# Patient Record
Sex: Female | Born: 1950 | Race: White | Hispanic: No | Marital: Married | Smoking: Never smoker
Health system: Southern US, Community
[De-identification: ages and names within clinical notes are randomized; demographics above are authoritative.]

## PROBLEM LIST (undated history)

## (undated) DIAGNOSIS — J189 Pneumonia, unspecified organism: Secondary | ICD-10-CM

## (undated) DIAGNOSIS — K635 Polyp of colon: Secondary | ICD-10-CM

## (undated) DIAGNOSIS — T7840XA Allergy, unspecified, initial encounter: Secondary | ICD-10-CM

## (undated) HISTORY — PX: TONSILLECTOMY: SUR1361

## (undated) HISTORY — PX: APPENDECTOMY: SHX54

## (undated) HISTORY — PX: VAGINAL HYSTERECTOMY: SHX2639

## (undated) HISTORY — DX: Polyp of colon: K63.5

## (undated) HISTORY — DX: Allergy, unspecified, initial encounter: T78.40XA

## (undated) HISTORY — DX: Pneumonia, unspecified organism: J18.9

---

## 1999-03-25 ENCOUNTER — Other Ambulatory Visit: Admission: RE | Admit: 1999-03-25 | Discharge: 1999-03-25 | Payer: Self-pay | Admitting: Obstetrics and Gynecology

## 2000-08-11 ENCOUNTER — Encounter: Admission: RE | Admit: 2000-08-11 | Discharge: 2000-08-11 | Payer: Self-pay | Admitting: Family Medicine

## 2000-08-11 ENCOUNTER — Encounter: Payer: Self-pay | Admitting: Family Medicine

## 2001-02-17 ENCOUNTER — Encounter: Admission: RE | Admit: 2001-02-17 | Discharge: 2001-02-17 | Payer: Self-pay | Admitting: Family Medicine

## 2001-02-17 ENCOUNTER — Encounter: Payer: Self-pay | Admitting: Family Medicine

## 2001-05-18 ENCOUNTER — Encounter (INDEPENDENT_AMBULATORY_CARE_PROVIDER_SITE_OTHER): Payer: Self-pay | Admitting: Specialist

## 2001-05-18 ENCOUNTER — Ambulatory Visit (HOSPITAL_BASED_OUTPATIENT_CLINIC_OR_DEPARTMENT_OTHER): Admission: RE | Admit: 2001-05-18 | Discharge: 2001-05-18 | Payer: Self-pay | Admitting: Plastic Surgery

## 2002-05-02 ENCOUNTER — Encounter: Admission: RE | Admit: 2002-05-02 | Discharge: 2002-05-02 | Payer: Self-pay | Admitting: Family Medicine

## 2002-05-02 ENCOUNTER — Encounter: Payer: Self-pay | Admitting: Family Medicine

## 2002-05-04 ENCOUNTER — Encounter: Payer: Self-pay | Admitting: Family Medicine

## 2002-05-04 ENCOUNTER — Encounter: Admission: RE | Admit: 2002-05-04 | Discharge: 2002-05-04 | Payer: Self-pay | Admitting: Family Medicine

## 2004-05-06 ENCOUNTER — Encounter: Admission: RE | Admit: 2004-05-06 | Discharge: 2004-05-06 | Payer: Self-pay | Admitting: Family Medicine

## 2004-05-14 ENCOUNTER — Ambulatory Visit (HOSPITAL_COMMUNITY): Admission: RE | Admit: 2004-05-14 | Discharge: 2004-05-14 | Payer: Self-pay | Admitting: Plastic Surgery

## 2004-05-14 ENCOUNTER — Encounter (INDEPENDENT_AMBULATORY_CARE_PROVIDER_SITE_OTHER): Payer: Self-pay | Admitting: Specialist

## 2004-05-14 ENCOUNTER — Ambulatory Visit (HOSPITAL_BASED_OUTPATIENT_CLINIC_OR_DEPARTMENT_OTHER): Admission: RE | Admit: 2004-05-14 | Discharge: 2004-05-14 | Payer: Self-pay | Admitting: Plastic Surgery

## 2004-07-08 ENCOUNTER — Other Ambulatory Visit: Admission: RE | Admit: 2004-07-08 | Discharge: 2004-07-08 | Payer: Self-pay | Admitting: Family Medicine

## 2005-07-03 ENCOUNTER — Encounter: Admission: RE | Admit: 2005-07-03 | Discharge: 2005-07-03 | Payer: Self-pay | Admitting: Family Medicine

## 2005-07-08 ENCOUNTER — Encounter: Admission: RE | Admit: 2005-07-08 | Discharge: 2005-07-08 | Payer: Self-pay | Admitting: Family Medicine

## 2005-08-14 ENCOUNTER — Other Ambulatory Visit: Admission: RE | Admit: 2005-08-14 | Discharge: 2005-08-14 | Payer: Self-pay | Admitting: Family Medicine

## 2006-01-08 ENCOUNTER — Encounter: Admission: RE | Admit: 2006-01-08 | Discharge: 2006-01-08 | Payer: Self-pay | Admitting: Family Medicine

## 2006-07-05 ENCOUNTER — Encounter: Admission: RE | Admit: 2006-07-05 | Discharge: 2006-07-05 | Payer: Self-pay | Admitting: Family Medicine

## 2007-05-13 ENCOUNTER — Other Ambulatory Visit: Admission: RE | Admit: 2007-05-13 | Discharge: 2007-05-13 | Payer: Self-pay | Admitting: Family Medicine

## 2007-07-25 ENCOUNTER — Encounter: Admission: RE | Admit: 2007-07-25 | Discharge: 2007-07-25 | Payer: Self-pay | Admitting: Family Medicine

## 2008-08-30 ENCOUNTER — Encounter: Admission: RE | Admit: 2008-08-30 | Discharge: 2008-08-30 | Payer: Self-pay | Admitting: Family Medicine

## 2009-08-02 ENCOUNTER — Other Ambulatory Visit: Admission: RE | Admit: 2009-08-02 | Discharge: 2009-08-02 | Payer: Self-pay | Admitting: Family Medicine

## 2010-11-10 ENCOUNTER — Other Ambulatory Visit: Payer: Self-pay | Admitting: Family Medicine

## 2010-11-10 DIAGNOSIS — Z1231 Encounter for screening mammogram for malignant neoplasm of breast: Secondary | ICD-10-CM

## 2010-11-24 ENCOUNTER — Ambulatory Visit
Admission: RE | Admit: 2010-11-24 | Discharge: 2010-11-24 | Disposition: A | Discharge status: Home / Self Care | Payer: BC Managed Care – PPO | Source: Ambulatory Visit | Attending: Family Medicine | Admitting: Family Medicine

## 2010-11-24 DIAGNOSIS — Z1231 Encounter for screening mammogram for malignant neoplasm of breast: Secondary | ICD-10-CM

## 2010-12-05 NOTE — Op Note (Signed)
[place]. [facility]  Patient:    Melanie Phelps, Melanie Phelps Visit Number: [id number] MRN: [id number]          Service Type: DSU Location: [facility] Attending Physician:  Chapman Moss Dictated by:   Teena Irani. Odis Luster, M.D. Proc. Date: 05/18/01 Admit Date:  05/18/2001                             Operative Report  PREOPERATIVE DIAGNOSES: 1. Lesions, face x 3, undetermined behavior. 2. Lesion, neck, undetermined behavior. 3. Lesions, trunk x 11, all less than 0.5 cm, of undetermined behavior. 4. Lesions, trunk x 2, greater than 0.5 cm, of undetermined behavior.  POSTOPERATIVE DIAGNOSES: 1. Lesions, face x 3, undetermined behavior. 2. Lesion, neck, undetermined behavior. 3. Lesions, trunk x 11, all less than 0.5 cm, of undetermined behavior. 4. Lesions, trunk x 2, greater than 0.5 cm, of undetermined behavior.  PROCEDURES: 1. Excision lesions, face x 3, of undetermined behavior, less than 0.5 cm. 2. Excision lesion, neck, less than 0.5 cm, of undetermined behavior. 3. Excision lesions of the trunk x 11, less than 0.5 cm, of undetermined    behavior. 4. Excision lesions of the trunk greater than 0.5 cm, of undetermined    behavior, x 2.  SURGEON:  Teena Irani. Odis Luster, M.D.  ANESTHESIA:  1% Xylocaine with epinephrine plus bicarb.  CLINICAL NOTE:  This is a 60 year old woman who has multiple pigmented lesions with irregular borders that have changed, and it is felt that it is medically necessary to excise these.  Initially the procedure and the risks were discussed with her, including the possibility of further surgery depending on the final pathology report.  She also understood the risk of scarring, wound healing problems, and wished to proceed.  DESCRIPTION OF PROCEDURE:  The patient taken to the operating room and placed first supine.  She was prepped with Betadine and draped with sterile drapes. Satisfactory local anesthesia was achieved, and the  excisions were performed in the direction of the relaxed skin tension lines.  The wound was irrigated thoroughly, good hemostasis had been noted.  The closure was with 6-0 Prolene simple interrupted sutures for the facial area in the neck and 6-0 and 5-0 Prolene simple interrupted sutures for the anterior chest.  Patient then placed left lateral decubitus and an additional six lesions were marked on her back, prepped with Betadine, draped with sterile drapes.  Again subsequently anesthesia achieved, elliptical excisions were performed, and the wound was irrigated thoroughly and good hemostasis having been confirmed, the closure was with 5-0 Prolene simple interrupted sutures.  Two of these wounds did require some 4-0 Monocryl interrupted inverted deep sutures and then 4-0 Monocryl simple interrupted suture.  Antibiotic ointment and dry sterile dressings were applied.  She tolerated the procedure well.  DISPOSITION:  Darvocet-N 100 one p.o. q.4h., for pain.  She will call the office for an appointment next week.  No exercising, no activities until I see her back next week. Dictated by:   Teena Irani. Odis Luster, M.D. Attending Physician:  Chapman Moss DD:  05/18/01 TD:  05/19/01 Job: [id number]

## 2010-12-05 NOTE — Op Note (Signed)
[name], Melanie Phelps           ACCOUNT NO.:  [id number]   MEDICAL RECORD NO.:  [id number]          PATIENT TYPE:  AMB   LOCATION:  DSC                          FACILITY:  [facility]   PHYSICIAN:  Etter Sjogren, M.D.     DATE OF BIRTH:  31-Aug-1950   DATE OF PROCEDURE:  05/14/2004  DATE OF DISCHARGE:                                 OPERATIVE REPORT   PREOPERATIVE DIAGNOSIS:  Multiple skin lesions of undetermined behavior, two  greater than 0.5 cm, two less than 0.5 cm.   POSTOPERATIVE DIAGNOSIS:  Multiple skin lesions of undetermined behavior,  two greater than 0.5 cm, two less than 0.5 cm.   PROCEDURE PERFORMED:  Excision multiple skin lesions of undetermined  behavior, one on the neck less than 0.5 cm, three on the chest, two of which  are greater than 0.5 cm.   SURGEON:  Etter Sjogren, M.D.   ANESTHESIA:  1% Xylocaine with epinephrine.   CLINICAL NOTE:  60 year old woman with multiple lesions, irritated,  changing, enlarging, and it is medically necessary to remove them.  The  nature of the procedure and risks were discussed including scarring,  recurrence, further surgery depending upon the final pathology report.  She  wished to proceed.   DESCRIPTION OF PROCEDURE:  The patient was taken to the operating room and  placed prone.  She was prepped with Betadine and draped with sterile drapes.  A satisfactory level of anesthsia was achieved with 1% Xylocaine with  epinephrine plus bicarb.  An excision was performed.  The closure was with 4-  0 Monocryl interrupted deep suture and 4-0 Monocryl running subcuticular  suture.  Steri-Strips and dry, sterile dressings were applied.  She  tolerated the procedure well.   DISPOSITION:  Recheck in the office next week.       DB/MEDQ  D:  05/14/2004  T:  05/14/2004  Job:  [id number]

## 2013-05-19 ENCOUNTER — Other Ambulatory Visit (HOSPITAL_COMMUNITY)
Admission: RE | Admit: 2013-05-19 | Discharge: 2013-05-19 | Disposition: A | Discharge status: Home / Self Care | Payer: BC Managed Care – PPO | Source: Ambulatory Visit | Attending: Family Medicine | Admitting: Family Medicine

## 2013-05-19 ENCOUNTER — Other Ambulatory Visit: Payer: Self-pay | Admitting: Family Medicine

## 2013-05-19 DIAGNOSIS — Z124 Encounter for screening for malignant neoplasm of cervix: Secondary | ICD-10-CM | POA: Insufficient documentation

## 2013-05-19 DIAGNOSIS — Z1151 Encounter for screening for human papillomavirus (HPV): Secondary | ICD-10-CM | POA: Insufficient documentation

## 2015-06-10 ENCOUNTER — Other Ambulatory Visit: Payer: Self-pay

## 2015-06-10 DIAGNOSIS — Z1231 Encounter for screening mammogram for malignant neoplasm of breast: Secondary | ICD-10-CM

## 2015-06-11 ENCOUNTER — Ambulatory Visit: Payer: BC Managed Care – PPO

## 2015-06-12 ENCOUNTER — Ambulatory Visit
Admission: RE | Admit: 2015-06-12 | Discharge: 2015-06-12 | Disposition: A | Discharge status: Home / Self Care | Payer: BLUE CROSS/BLUE SHIELD | Source: Ambulatory Visit

## 2015-06-12 DIAGNOSIS — Z1231 Encounter for screening mammogram for malignant neoplasm of breast: Secondary | ICD-10-CM

## 2016-09-21 ENCOUNTER — Other Ambulatory Visit: Payer: Self-pay | Admitting: Family Medicine

## 2016-09-21 DIAGNOSIS — Z1231 Encounter for screening mammogram for malignant neoplasm of breast: Secondary | ICD-10-CM

## 2016-10-12 ENCOUNTER — Ambulatory Visit: Payer: BLUE CROSS/BLUE SHIELD

## 2016-10-28 ENCOUNTER — Encounter: Payer: Self-pay | Admitting: Radiology

## 2016-10-28 ENCOUNTER — Ambulatory Visit
Admission: RE | Admit: 2016-10-28 | Discharge: 2016-10-28 | Disposition: A | Discharge status: Home / Self Care | Payer: BLUE CROSS/BLUE SHIELD | Source: Ambulatory Visit | Attending: Family Medicine | Admitting: Family Medicine

## 2016-10-28 DIAGNOSIS — Z1231 Encounter for screening mammogram for malignant neoplasm of breast: Secondary | ICD-10-CM

## 2016-10-29 ENCOUNTER — Other Ambulatory Visit: Payer: Self-pay | Admitting: Family Medicine

## 2016-10-29 DIAGNOSIS — R928 Other abnormal and inconclusive findings on diagnostic imaging of breast: Secondary | ICD-10-CM

## 2016-11-02 ENCOUNTER — Ambulatory Visit
Admission: RE | Admit: 2016-11-02 | Discharge: 2016-11-02 | Disposition: A | Discharge status: Home / Self Care | Payer: BLUE CROSS/BLUE SHIELD | Source: Ambulatory Visit | Attending: Family Medicine | Admitting: Family Medicine

## 2016-11-02 DIAGNOSIS — R928 Other abnormal and inconclusive findings on diagnostic imaging of breast: Secondary | ICD-10-CM

## 2017-05-02 DIAGNOSIS — Z23 Encounter for immunization: Secondary | ICD-10-CM | POA: Diagnosis not present

## 2017-05-10 DIAGNOSIS — M25572 Pain in left ankle and joints of left foot: Secondary | ICD-10-CM | POA: Diagnosis not present

## 2017-05-12 DIAGNOSIS — R05 Cough: Secondary | ICD-10-CM | POA: Diagnosis not present

## 2017-05-29 DIAGNOSIS — M940 Chondrocostal junction syndrome [Tietze]: Secondary | ICD-10-CM | POA: Diagnosis not present

## 2017-05-29 DIAGNOSIS — J069 Acute upper respiratory infection, unspecified: Secondary | ICD-10-CM | POA: Diagnosis not present

## 2017-06-08 DIAGNOSIS — J209 Acute bronchitis, unspecified: Secondary | ICD-10-CM | POA: Diagnosis not present

## 2017-06-14 DIAGNOSIS — J329 Chronic sinusitis, unspecified: Secondary | ICD-10-CM | POA: Diagnosis not present

## 2017-06-14 DIAGNOSIS — R05 Cough: Secondary | ICD-10-CM | POA: Diagnosis not present

## 2017-06-14 DIAGNOSIS — R509 Fever, unspecified: Secondary | ICD-10-CM | POA: Diagnosis not present

## 2017-06-15 ENCOUNTER — Other Ambulatory Visit: Payer: Self-pay | Admitting: Family Medicine

## 2017-06-15 ENCOUNTER — Ambulatory Visit
Admission: RE | Admit: 2017-06-15 | Discharge: 2017-06-15 | Disposition: A | Discharge status: Home / Self Care | Payer: BLUE CROSS/BLUE SHIELD | Source: Ambulatory Visit | Attending: Family Medicine | Admitting: Family Medicine

## 2017-06-15 DIAGNOSIS — R059 Cough, unspecified: Secondary | ICD-10-CM

## 2017-06-15 DIAGNOSIS — R05 Cough: Secondary | ICD-10-CM | POA: Diagnosis not present

## 2017-06-17 ENCOUNTER — Telehealth: Payer: Self-pay | Admitting: Pulmonary Disease

## 2017-06-17 NOTE — Telephone Encounter (Signed)
error 

## 2017-06-21 ENCOUNTER — Encounter: Payer: Self-pay | Admitting: Pulmonary Disease

## 2017-06-21 ENCOUNTER — Ambulatory Visit (INDEPENDENT_AMBULATORY_CARE_PROVIDER_SITE_OTHER): Payer: BLUE CROSS/BLUE SHIELD | Admitting: Pulmonary Disease

## 2017-06-21 VITALS — BP 110/65 | HR 87 | Ht 61.5 in | Wt 134.0 lb

## 2017-06-21 DIAGNOSIS — J90 Pleural effusion, not elsewhere classified: Secondary | ICD-10-CM

## 2017-06-21 DIAGNOSIS — J189 Pneumonia, unspecified organism: Secondary | ICD-10-CM

## 2017-06-21 NOTE — Patient Instructions (Signed)
Left-sided pleural effusion: I believe that this is a parapneumonic effusion, or pneumonia associated pleural fluid To assess this further we will arrange for an ultrasound-guided thoracentesis of the left pleural effusion: We will send lab work for cell count and differential, culture, cytology, LDH, glucose, pH, protein, albumin.  We will also need to get 2 blood tests: LDH and protein  Community-acquired pneumonia: As we stated today in clinic, this will make you feel rundown and cough for weeks to months We will follow you closely with repeat chest x-rays to make sure this goes away, we will arrange for the next visit with our nurse practitioner in 3 weeks either here or at [facility]
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
## 2017-06-23 ENCOUNTER — Ambulatory Visit (HOSPITAL_COMMUNITY)
Admission: RE | Admit: 2017-06-23 | Discharge: 2017-06-23 | Disposition: A | Discharge status: Home / Self Care | Payer: BLUE CROSS/BLUE SHIELD | Source: Ambulatory Visit | Attending: Pulmonary Disease | Admitting: Pulmonary Disease

## 2017-06-23 ENCOUNTER — Other Ambulatory Visit: Payer: Self-pay | Admitting: Pulmonary Disease

## 2017-06-23 ENCOUNTER — Ambulatory Visit (INDEPENDENT_AMBULATORY_CARE_PROVIDER_SITE_OTHER)
Admission: RE | Admit: 2017-06-23 | Discharge: 2017-06-23 | Disposition: A | Discharge status: Home / Self Care | Payer: BLUE CROSS/BLUE SHIELD | Source: Ambulatory Visit | Attending: Pulmonary Disease | Admitting: Pulmonary Disease

## 2017-06-23 DIAGNOSIS — R0600 Dyspnea, unspecified: Secondary | ICD-10-CM

## 2017-06-23 DIAGNOSIS — Z538 Procedure and treatment not carried out for other reasons: Secondary | ICD-10-CM | POA: Insufficient documentation

## 2017-06-23 DIAGNOSIS — J90 Pleural effusion, not elsewhere classified: Secondary | ICD-10-CM

## 2017-06-23 LAB — PROTEIN ELECTROPHORESIS, SERUM
A/G Ratio: 1 (ref 0.7–1.7)
ALPHA 1: 0.4 g/dL (ref 0.0–0.4)
ALPHA 2: 1.2 g/dL — AB (ref 0.4–1.0)
Albumin ELP: 2.9 g/dL (ref 2.9–4.4)
Beta: 1 g/dL (ref 0.7–1.3)
GAMMA GLOBULIN: 0.5 g/dL (ref 0.4–1.8)
Globulin, Total: 3 g/dL (ref 2.2–3.9)
Total Protein: 5.9 g/dL — ABNORMAL LOW (ref 6.0–8.5)

## 2017-06-23 LAB — LACTATE DEHYDROGENASE: LDH: 168 IU/L (ref 119–226)

## 2017-06-23 MED ORDER — LIDOCAINE 2% (20 MG/ML) 5 ML SYRINGE
INTRAMUSCULAR | Status: AC
  Filled 2017-06-23: qty 10

## 2017-06-23 MED ORDER — LIDOCAINE HCL (PF) 2 % IJ SOLN: INTRAMUSCULAR | Status: DC | PRN

## 2017-06-25 ENCOUNTER — Ambulatory Visit: Payer: BLUE CROSS/BLUE SHIELD | Admitting: Adult Health

## 2017-06-25 ENCOUNTER — Ambulatory Visit (INDEPENDENT_AMBULATORY_CARE_PROVIDER_SITE_OTHER)
Admission: RE | Admit: 2017-06-25 | Discharge: 2017-06-25 | Disposition: A | Discharge status: Home / Self Care | Payer: BLUE CROSS/BLUE SHIELD | Source: Ambulatory Visit | Attending: Adult Health | Admitting: Adult Health

## 2017-06-25 ENCOUNTER — Encounter: Payer: Self-pay | Admitting: Adult Health

## 2017-06-25 ENCOUNTER — Other Ambulatory Visit (INDEPENDENT_AMBULATORY_CARE_PROVIDER_SITE_OTHER): Payer: BLUE CROSS/BLUE SHIELD

## 2017-06-25 VITALS — BP 112/70 | HR 104 | Temp 98.7°F | Wt 132.6 lb

## 2017-06-25 DIAGNOSIS — R059 Cough, unspecified: Secondary | ICD-10-CM

## 2017-06-25 DIAGNOSIS — R05 Cough: Secondary | ICD-10-CM

## 2017-06-25 DIAGNOSIS — R0989 Other specified symptoms and signs involving the circulatory and respiratory systems: Secondary | ICD-10-CM

## 2017-06-25 DIAGNOSIS — R509 Fever, unspecified: Secondary | ICD-10-CM

## 2017-06-25 DIAGNOSIS — J90 Pleural effusion, not elsewhere classified: Secondary | ICD-10-CM | POA: Insufficient documentation

## 2017-06-25 DIAGNOSIS — J189 Pneumonia, unspecified organism: Secondary | ICD-10-CM | POA: Insufficient documentation

## 2017-06-25 DIAGNOSIS — R0602 Shortness of breath: Secondary | ICD-10-CM

## 2017-06-25 LAB — BASIC METABOLIC PANEL
BUN: 13 mg/dL (ref 6–23)
CALCIUM: 9.2 mg/dL (ref 8.4–10.5)
CHLORIDE: 104 meq/L (ref 96–112)
CO2: 27 meq/L (ref 19–32)
CREATININE: 0.6 mg/dL (ref 0.40–1.20)
GFR: 106.04 mL/min (ref >60.00)
GLUCOSE: 108 mg/dL — AB (ref 70–99)
Potassium: 4.2 mEq/L (ref 3.5–5.1)
Sodium: 140 mEq/L (ref 135–145)

## 2017-06-25 LAB — CBC WITH DIFFERENTIAL/PLATELET
BASOS ABS: 0.1 10*3/uL (ref 0.0–0.1)
BASOS PCT: 0.5 % (ref 0.0–3.0)
EOS ABS: 0 10*3/uL (ref 0.0–0.7)
Eosinophils Relative: 0.2 % (ref 0.0–5.0)
HEMATOCRIT: 37 % (ref 36.0–46.0)
Hemoglobin: 12.1 g/dL (ref 12.0–15.0)
LYMPHS ABS: 1.3 10*3/uL (ref 0.7–4.0)
Lymphocytes Relative: 8.2 % — ABNORMAL LOW (ref 12.0–46.0)
MCHC: 32.8 g/dL (ref 30.0–36.0)
MCV: 92 fl (ref 78.0–100.0)
MONO ABS: 1 10*3/uL (ref 0.1–1.0)
Monocytes Relative: 6.3 % (ref 3.0–12.0)
NEUTROS ABS: 13 10*3/uL — AB (ref 1.4–7.7)
NEUTROS PCT: 84.8 % — AB (ref 43.0–77.0)
PLATELETS: 715 10*3/uL — AB (ref 150.0–400.0)
RBC: 4.02 Mil/uL (ref 3.87–5.11)
RDW: 13.4 % (ref 11.5–15.5)
WBC: 15.3 10*3/uL — ABNORMAL HIGH (ref 4.0–10.5)

## 2017-06-25 LAB — BRAIN NATRIURETIC PEPTIDE: Pro B Natriuretic peptide (BNP): 205 pg/mL — ABNORMAL HIGH (ref 0.0–100.0)

## 2017-06-25 LAB — SEDIMENTATION RATE: Sed Rate: 63 mm/hr — ABNORMAL HIGH (ref 0–30)

## 2017-06-25 MED ORDER — AMOXICILLIN-POT CLAVULANATE 875-125 MG PO TABS: 1.0000 | ORAL_TABLET | Freq: Two times a day (BID) | ORAL | 0 refills | Status: AC

## 2017-06-25 NOTE — Progress Notes (Signed)
Reviewed, agree, may need CT chest next week

## 2017-06-25 NOTE — Progress Notes (Signed)
@Patient  ID: Melanie Phelps, female    DOB: 12-21-1950, 66 y.o.   MRN: [id number]  Chief Complaint  Patient presents with  . Follow-up    PNA     Referring provider: Maurice Small, MD  HPI: 66 yo female never smoker seen for pulmonary consult for PNA 06/21/17  Hx of ARDS in 1992 (twin birth)   06/25/2017 Acute visit : PNA and Pleural Effusion  Presents for an acute office visit.  Patient was seen in the office for a pulmonary consult on June 21, 2017.  Patient complaining of over the last 2 months she has had intermittent cough and shortness of breath.  She was recently seen by her primary care physician and treated for a bronchitis.  She took a Z-Pak without significant improvement.  Chest x-ray was done on November 27 showing a small right-sided pleural effusion and a moderate left-sided effusion..  Patient was started on Augmentin for 10 days.  She finished her last dose yesterday.  Patient says she was starting to improve with decreased cough.  However last night she noticed that she was running a fever again with temperature at 101.5.  She denies any hemoptysis chest pain orthopnea PND or leg swelling.  Patient says she continues to have sinus congestion drainage and intermittent green nasal discharge.  Appetite is good with no nausea vomiting or diarrhea.. Patient was felt to have a possible parapneumonic effusion and was sent for a thoracentesis however effusions had decreased in size and was too small for thoracentesis.. Chest x-ray today shows small left effusion and a trace right pleural effusion. .      Allergies  Allergen Reactions  . Ciprofloxacin Rash    Immunization History  Administered Date(s) Administered  . Influenza,inj,Quad PF,6+ Mos 05/26/2017    History reviewed. No pertinent past medical history.  Tobacco History: Social History   Tobacco Use  Smoking Status Never Smoker  Smokeless Tobacco Never Used   Counseling given: Not  Answered   Outpatient Encounter Medications as of 06/25/2017  Medication Sig  . amoxicillin-clavulanate (AUGMENTIN) 875-125 MG tablet   . fluticasone (FLONASE) 50 MCG/ACT nasal spray   . ipratropium (ATROVENT) 0.06 % nasal spray   . montelukast (SINGULAIR) 10 MG tablet Take 10 mg by mouth at bedtime.  . Multiple Vitamin (MULTIVITAMIN) capsule Take by mouth.  Marland Kitchen amoxicillin-clavulanate (AUGMENTIN) 875-125 MG tablet Take 1 tablet by mouth 2 (two) times daily for 4 days.   No facility-administered encounter medications on file as of 06/25/2017.      Review of Systems  Constitutional:   No  weight loss, night sweats,   +Fevers, chills, fatigue, or  lassitude.  HEENT:   No headaches,  Difficulty swallowing,  Tooth/dental problems, or  Sore throat,                No sneezing, itching, ear ache,  +nasal congestion, post nasal drip,   CV:  No chest pain,  Orthopnea, PND, swelling in lower extremities, anasarca, dizziness, palpitations, syncope.   GI  No heartburn, indigestion, abdominal pain, nausea, vomiting, diarrhea, change in bowel habits, loss of appetite, bloody stools.   Resp:   No chest wall deformity  Skin: no rash or lesions.  GU: no dysuria, change in color of urine, no urgency or frequency.  No flank pain, no hematuria   MS:  No joint pain or swelling.  No decreased range of motion.  No back pain.    Physical Exam  BP 112/70 (BP  Location: Left Arm, Cuff Size: Normal)   Pulse (!) 104   Temp 98.7 F (37.1 C) (Oral)   Wt 132 lb 9.6 oz (60.1 kg)   SpO2 96%   BMI 24.65 kg/m   GEN: A/Ox3; pleasant , NAD    HEENT:  [place]/AT,  EACs-clear, TMs-wnl, NOSE-clear, THROAT-clear, no lesions, no postnasal drip or exudate noted.   NECK:  Supple w/ fair ROM; no JVD; normal carotid impulses w/o bruits; no thyromegaly or nodules palpated; no lymphadenopathy.    RESP  Clear  P & A; w/o, wheezes/ rales/ or rhonchi. no accessory muscle use, no dullness to percussion  CARD:  RRR, no  m/r/g, no peripheral edema, pulses intact, no cyanosis or clubbing.  GI:   Soft & nt; nml bowel sounds; no organomegaly or masses detected.   Musco: Warm bil, no deformities or joint swelling noted.   Neuro: alert, no focal deficits noted.    Skin: Warm, no lesions or rashes    Lab Results:  CBC  Imaging: Dg Chest 2 View  Result Date: 06/25/2017 CLINICAL DATA:  Persistent fever, cough, and congestion. EXAM: CHEST  2 VIEW COMPARISON:  Chest x-ray dated June 23, 2017. FINDINGS: The cardiomediastinal silhouette is borderline enlarged. Normal pulmonary vascularity. Stable small left and trace right pleural effusions. No consolidation or pneumothorax. No acute osseous abnormality. IMPRESSION: 1. Stable small left and trace right pleural effusions. No consolidation. Electronically Signed   By: Titus Dubin M.D.   On: 06/25/2017 11:24   Dg Chest 2 View  Result Date: 06/23/2017 CLINICAL DATA:  Pleural effusion and dyspnea EXAM: CHEST  2 VIEW COMPARISON:  06/15/2017 FINDINGS: Interval decrease in bilateral pleural effusions. There is minimal blunting of the right costophrenic angle laterally from trace to small pleural effusion with small to moderate left effusion spanning 2 vertebral body heights on the lateral view versus 2-1/2 vertebral body heights previously. Heart is top normal. There is minimal aortic atherosclerosis. No pneumonic consolidations. There is atelectasis at each lung base. No acute nor suspicious osseous abnormality. IMPRESSION: Interval decrease in bilateral pleural effusions. Electronically Signed   By: Ashley Royalty M.D.   On: 06/23/2017 21:59   Dg Chest 2 View  Result Date: 06/15/2017 CLINICAL DATA:  66 year old female with dry cough for 8 weeks. Chest soreness and shortness of breath. Now with fever. Initial encounter. EXAM: CHEST  2 VIEW COMPARISON:  None. FINDINGS: Moderate left-sided and small right-sided pleural effusions. Basilar consolidation may represent  associated atelectasis and/ or infiltrate. Difficult to exclude underlying mass. Cardiomegaly (cannot exclude pericardial effusion). Central pulmonary vascular prominence. No cephalization of vasculature as may be expected with pulmonary edema. No acute osseous abnormality. IMPRESSION: Moderate left-sided and small right-sided pleural effusions. Basilar consolidation may represent associated infiltrate and/ or atelectasis. Follow-up until clearance will be necessary to exclude underlying mass. Although there is cardiomegaly and central pulmonary vascular prominence, the pleural effusions are not in a typical distribution for pulmonary edema (greater on the left than on the right). Additionally, patient has a fever. These results will be called to the ordering clinician or representative by the Radiologist Assistant, and communication documented in the PACS or zVision Dashboard. Electronically Signed   By: Genia Del M.D.   On: 06/15/2017 10:58   Ir US Chest  Result Date: 06/23/2017 CLINICAL DATA:  Left-sided pleural effusion. Please perform ultrasound-guided thoracentesis. EXAM: CHEST ULTRASOUND COMPARISON:  Chest radiograph - 06/15/2017 FINDINGS: Sonographic evaluation demonstrates only a trace amount of left-sided pleural fluid, too small  to allow for safe ultrasound-guided thoracentesis. Sonographic evaluation demonstrates no significant right-sided pleural fluid. IMPRESSION: 1. Trace amount of left-sided pleural fluid, too small to allow for safe ultrasound-guided thoracentesis. 2. No significant right-sided pleural fluid. Electronically Signed   By: Sandi Mariscal M.D.   On: 06/23/2017 13:22     Assessment & Plan:   Pneumonia Suspected community-acquired pneumonia with. Pneumonic effusions improving with antibiotics. Unclear why patient has persistent fevers influenza swab is negative today.  Labs are pending with CBC We will extend out Augmentin for an additional 4 days.  Patient will return for  follow-up in 4 days.  If fevers persist may need to do a CT sinus and chest.  Plan  Patient Instructions  Extend Augmentin for 4 days. Take with food, eat yogurt.  Mucinex DM Twice daily  As needed  Cough/[place] .  Fluids rest and tylenol As needed   Labs today .  Follow up with Dr. [place] Bells or Bora Broner NP in 4 days .  Please contact office for sooner follow up if symptoms do not improve or worsen or seek emergency care       Pleural effusion L>R Effusions now resolving with abx  Most likely parapneumonic .  Will continue to follow on cxr  Labs pending .      Rexene Edison, NP 06/25/2017

## 2017-06-25 NOTE — Patient Instructions (Addendum)
Extend Augmentin for 4 days. Take with food, eat yogurt.  Mucinex DM Twice daily  As needed  Cough/[place] .  Fluids rest and tylenol As needed   Labs today .  Follow up with Dr. [place] Bells or Ruthella Kirchman NP in 4 days .  Please contact office for sooner follow up if symptoms do not improve or worsen or seek emergency care

## 2017-06-25 NOTE — Assessment & Plan Note (Signed)
Suspected community-acquired pneumonia with. Pneumonic effusions improving with antibiotics. Unclear why patient has persistent fevers influenza swab is negative today.  Labs are pending with CBC We will extend out Augmentin for an additional 4 days.  Patient will return for follow-up in 4 days.  If fevers persist may need to do a CT sinus and chest.  Plan  Patient Instructions  Extend Augmentin for 4 days. Take with food, eat yogurt.  Mucinex DM Twice daily  As needed  Cough/[place] .  Fluids rest and tylenol As needed   Labs today .  Follow up with Dr. Lake Bells or Weyman Bogdon NP in 4 days .  Please contact office for sooner follow up if symptoms do not improve or worsen or seek emergency care

## 2017-06-25 NOTE — Assessment & Plan Note (Signed)
L>R Effusions now resolving with abx  Most likely parapneumonic .  Will continue to follow on cxr  Labs pending .

## 2017-06-29 ENCOUNTER — Ambulatory Visit: Payer: BLUE CROSS/BLUE SHIELD | Admitting: Adult Health

## 2017-06-30 ENCOUNTER — Encounter: Payer: Self-pay | Admitting: Adult Health

## 2017-06-30 ENCOUNTER — Other Ambulatory Visit: Payer: Self-pay | Admitting: Adult Health

## 2017-06-30 ENCOUNTER — Ambulatory Visit (INDEPENDENT_AMBULATORY_CARE_PROVIDER_SITE_OTHER)
Admission: RE | Admit: 2017-06-30 | Discharge: 2017-06-30 | Disposition: A | Discharge status: Home / Self Care | Payer: BLUE CROSS/BLUE SHIELD | Source: Ambulatory Visit | Attending: Adult Health | Admitting: Adult Health

## 2017-06-30 ENCOUNTER — Ambulatory Visit: Payer: BLUE CROSS/BLUE SHIELD | Admitting: Adult Health

## 2017-06-30 DIAGNOSIS — J90 Pleural effusion, not elsewhere classified: Secondary | ICD-10-CM

## 2017-06-30 DIAGNOSIS — J189 Pneumonia, unspecified organism: Secondary | ICD-10-CM | POA: Diagnosis not present

## 2017-06-30 NOTE — Progress Notes (Signed)
@Patient  ID: Melanie Phelps, female    DOB: 1950-11-09, 66 y.o.   MRN: [id number]  Chief Complaint  Patient presents with  . 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  Patient was seen last visit after she was seen for pneumonia and pleural effusion.  Over the last 2 months she had intermittent cough and shortness of breath.  She was initially seen by her primary care physician and treated for bronchitis with a Z-Pak.  Chest x-ray on November 27 showed a small right-sided pleural effusion and a moderate-sized left effusion.  Patient was started on a 10-day course of Augmentin.  Last week patient was continued to have persistent fevers up to 101.5.  Patient had been sent for a thoracentesis however there was not enough pleural fluid for tap.  Chest x-ray last week showed decreased bilateral effusions.  Augmentin was extended for additional 4 days for a total of a 14-day course.  Patient says she is feeling much better.  Fevers have resolved.  She feels like she is turning the corner.  Appetite is good with no nausea vomiting diarrhea.  Chest x-ray today shows resolution of right sided effusion.  And a small left pleural effusion..      Allergies  Allergen Reactions  . Ciprofloxacin Rash    Immunization History  Administered Date(s) Administered  . Influenza,inj,Quad PF,6+ Mos 05/26/2017    History reviewed. No pertinent past medical history.  Tobacco History: Social History   Tobacco Use  Smoking Status Never Smoker  Smokeless Tobacco Never Used   Counseling given: Not Answered   Outpatient Encounter Medications as of 06/30/2017  Medication Sig  . fluticasone (FLONASE) 50 MCG/ACT nasal spray   . ipratropium (ATROVENT) 0.06 % nasal spray   . montelukast  (SINGULAIR) 10 MG tablet Take 10 mg by mouth at bedtime.  . Multiple Vitamin (MULTIVITAMIN) capsule Take by mouth.  . [DISCONTINUED] amoxicillin-clavulanate (AUGMENTIN) 875-125 MG tablet    No facility-administered encounter medications on file as of 06/30/2017.      Review of Systems  Constitutional:   No  weight loss, night sweats,  Fevers, chills, fatigue, or  lassitude.  HEENT:   No headaches,  Difficulty swallowing,  Tooth/dental problems, or  Sore throat,                No sneezing, itching, ear ache, nasal congestion, post nasal drip,   CV:  No chest pain,  Orthopnea, PND, swelling in lower extremities, anasarca, dizziness, palpitations, syncope.   GI  No heartburn, indigestion, abdominal pain, nausea, vomiting, diarrhea, change in bowel habits, loss of appetite, bloody stools.   Resp: No shortness of breath with exertion or at rest.  No excess mucus, no productive cough,  No non-productive cough,  No coughing up of blood.  No change in color of mucus.  No wheezing.  No chest wall deformity  Skin: no rash or lesions.  GU: no dysuria, change in color of urine, no urgency or frequency.  No flank pain, no hematuria   MS:  No joint pain or swelling.  No decreased range of motion.  No back pain.    Physical Exam  BP 116/72 (BP Location: Left Arm, Cuff Size: Normal)   Pulse 85  Ht 5' 1.5" (1.562 m)   Wt 131 lb 12.8 oz (59.8 kg)   SpO2 97%   BMI 24.50 kg/m   GEN: A/Ox3; pleasant , NAD, well nourished    HEENT:  [place]/AT,  EACs-clear, TMs-wnl, NOSE-clear, THROAT-clear, no lesions, no postnasal drip or exudate noted.   NECK:  Supple w/ fair ROM; no JVD; normal carotid impulses w/o bruits; no thyromegaly or nodules palpated; no lymphadenopathy.    RESP  Clear  P & A; w/o, wheezes/ rales/ or rhonchi. no accessory muscle use, no dullness to percussion  CARD:  RRR, no m/r/g, no peripheral edema, pulses intact, no cyanosis or clubbing.  GI:   Soft & nt; nml bowel sounds; no  organomegaly or masses detected.   Musco: Warm bil, no deformities or joint swelling noted.   Neuro: alert, no focal deficits noted.    Skin: Warm, no lesions or rashes    Lab Results:  CBC    Component Value Date/Time   WBC 15.3 (H) 06/25/2017 1152   RBC 4.02 06/25/2017 1152   HGB 12.1 06/25/2017 1152   HCT 37.0 06/25/2017 1152   PLT 715.0 (H) 06/25/2017 1152   MCV 92.0 06/25/2017 1152   MCHC 32.8 06/25/2017 1152   RDW 13.4 06/25/2017 1152   LYMPHSABS 1.3 06/25/2017 1152   MONOABS 1.0 06/25/2017 1152   EOSABS 0.0 06/25/2017 1152   BASOSABS 0.1 06/25/2017 1152    BMET    Component Value Date/Time   NA 140 06/25/2017 1152   K 4.2 06/25/2017 1152   CL 104 06/25/2017 1152   CO2 27 06/25/2017 1152   GLUCOSE 108 (H) 06/25/2017 1152   BUN 13 06/25/2017 1152   CREATININE 0.60 06/25/2017 1152   CALCIUM 9.2 06/25/2017 1152    BNP No results found for: BNP  ProBNP    Component Value Date/Time   PROBNP 205.0 (H) 06/25/2017 1152    Imaging: Dg Chest 2 View  Result Date: 06/30/2017 CLINICAL DATA:  Follow-up pneumonia EXAM: CHEST  2 VIEW COMPARISON:  06/25/2017 FINDINGS: There is no focal parenchymal opacity. There is a small left pleural effusion. There is no right pleural effusion. The lungs are hyperinflated likely secondary to COPD. The heart and mediastinal contours are unremarkable. The osseous structures are unremarkable. IMPRESSION: 1. Small left pleural effusion. 2. COPD. Electronically Signed   By: Kathreen Devoid   On: 06/30/2017 10:38   Dg Chest 2 View  Result Date: 06/25/2017 CLINICAL DATA:  Persistent fever, cough, and congestion. EXAM: CHEST  2 VIEW COMPARISON:  Chest x-ray dated June 23, 2017. FINDINGS: The cardiomediastinal silhouette is borderline enlarged. Normal pulmonary vascularity. Stable small left and trace right pleural effusions. No consolidation or pneumothorax. No acute osseous abnormality. IMPRESSION: 1. Stable small left and trace right  pleural effusions. No consolidation. Electronically Signed   By: Titus Dubin M.D.   On: 06/25/2017 11:24   Dg Chest 2 View  Result Date: 06/23/2017 CLINICAL DATA:  Pleural effusion and dyspnea EXAM: CHEST  2 VIEW COMPARISON:  06/15/2017 FINDINGS: Interval decrease in bilateral pleural effusions. There is minimal blunting of the right costophrenic angle laterally from trace to small pleural effusion with small to moderate left effusion spanning 2 vertebral body heights on the lateral view versus 2-1/2 vertebral body heights previously. Heart is top normal. There is minimal aortic atherosclerosis. No pneumonic consolidations. There is atelectasis at each lung base. No acute nor suspicious osseous abnormality. IMPRESSION: Interval decrease in bilateral pleural effusions. Electronically Signed  By: Ashley Royalty M.D.   On: 06/23/2017 21:59   Dg Chest 2 View  Result Date: 06/15/2017 CLINICAL DATA:  66 year old female with dry cough for 8 weeks. Chest soreness and shortness of breath. Now with fever. Initial encounter. EXAM: CHEST  2 VIEW COMPARISON:  None. FINDINGS: Moderate left-sided and small right-sided pleural effusions. Basilar consolidation may represent associated atelectasis and/ or infiltrate. Difficult to exclude underlying mass. Cardiomegaly (cannot exclude pericardial effusion). Central pulmonary vascular prominence. No cephalization of vasculature as may be expected with pulmonary edema. No acute osseous abnormality. IMPRESSION: Moderate left-sided and small right-sided pleural effusions. Basilar consolidation may represent associated infiltrate and/ or atelectasis. Follow-up until clearance will be necessary to exclude underlying mass. Although there is cardiomegaly and central pulmonary vascular prominence, the pleural effusions are not in a typical distribution for pulmonary edema (greater on the left than on the right). Additionally, patient has a fever. These results will be called to the  ordering clinician or representative by the Radiologist Assistant, and communication documented in the PACS or zVision Dashboard. Electronically Signed   By: Genia Del M.D.   On: 06/15/2017 10:58   Ir US Chest  Result Date: 06/23/2017 CLINICAL DATA:  Left-sided pleural effusion. Please perform ultrasound-guided thoracentesis. EXAM: CHEST ULTRASOUND COMPARISON:  Chest radiograph - 06/15/2017 FINDINGS: Sonographic evaluation demonstrates only a trace amount of left-sided pleural fluid, too small to allow for safe ultrasound-guided thoracentesis. Sonographic evaluation demonstrates no significant right-sided pleural fluid. IMPRESSION: 1. Trace amount of left-sided pleural fluid, too small to allow for safe ultrasound-guided thoracentesis. 2. No significant right-sided pleural fluid. Electronically Signed   By: Sandi Mariscal M.D.   On: 06/23/2017 13:22     Assessment & Plan:   Pneumonia Pneumonia appears to be clinically improved.  Chest x-ray is showing gradual clearance.  No further antibiotics are indicated at this time.  Patient will follow-up in 4-6 weeks with a follow-up chest x-ray.  White blood cell count was elevated at last visit.  We will repeat an return visit.  Pleural effusion Pleural effusions most likely parapneumonic appeared to be resolving.  After antibiotic course.  Continue to follow-up with serial chest x-ray.Rexene Edison, NP 06/30/2017

## 2017-06-30 NOTE — Assessment & Plan Note (Signed)
Pneumonia appears to be clinically improved.  Chest x-ray is showing gradual clearance.  No further antibiotics are indicated at this time.  Patient will follow-up in 4-6 weeks with a follow-up chest x-ray.  White blood cell count was elevated at last visit.  We will repeat an return visit.

## 2017-06-30 NOTE — Assessment & Plan Note (Signed)
Pleural effusions most likely parapneumonic appeared to be resolving.  After antibiotic course.  Continue to follow-up with serial chest x-ray.Marland Kitchen

## 2017-06-30 NOTE — Progress Notes (Signed)
Reviewed, agree 

## 2017-06-30 NOTE — Patient Instructions (Signed)
Mucinex DM Twice daily  As needed  Cough/congeston .  Activity as tolerated.  Follow up with Dr. Lake Bells in 6 weeks and As needed   Please contact office for sooner follow up if symptoms do not improve or worsen or seek emergency care

## 2017-07-02 DIAGNOSIS — R739 Hyperglycemia, unspecified: Secondary | ICD-10-CM | POA: Diagnosis not present

## 2017-07-02 DIAGNOSIS — E785 Hyperlipidemia, unspecified: Secondary | ICD-10-CM | POA: Diagnosis not present

## 2017-07-02 DIAGNOSIS — Z5181 Encounter for therapeutic drug level monitoring: Secondary | ICD-10-CM | POA: Diagnosis not present

## 2017-07-02 DIAGNOSIS — Z23 Encounter for immunization: Secondary | ICD-10-CM | POA: Diagnosis not present

## 2017-07-02 DIAGNOSIS — M8580 Other specified disorders of bone density and structure, unspecified site: Secondary | ICD-10-CM | POA: Diagnosis not present

## 2017-07-02 DIAGNOSIS — Z0000 Encounter for general adult medical examination without abnormal findings: Secondary | ICD-10-CM | POA: Diagnosis not present

## 2017-07-09 ENCOUNTER — Ambulatory Visit: Payer: BLUE CROSS/BLUE SHIELD | Admitting: Pulmonary Disease

## 2017-08-05 DIAGNOSIS — J189 Pneumonia, unspecified organism: Secondary | ICD-10-CM | POA: Diagnosis not present

## 2017-08-05 DIAGNOSIS — J300 Vasomotor rhinitis: Secondary | ICD-10-CM | POA: Diagnosis not present

## 2017-08-13 ENCOUNTER — Ambulatory Visit: Payer: BLUE CROSS/BLUE SHIELD | Admitting: Pulmonary Disease

## 2017-08-13 ENCOUNTER — Encounter: Payer: Self-pay | Admitting: Pulmonary Disease

## 2017-08-13 ENCOUNTER — Ambulatory Visit (INDEPENDENT_AMBULATORY_CARE_PROVIDER_SITE_OTHER)
Admission: RE | Admit: 2017-08-13 | Discharge: 2017-08-13 | Disposition: A | Discharge status: Home / Self Care | Payer: BLUE CROSS/BLUE SHIELD | Source: Ambulatory Visit | Attending: Pulmonary Disease | Admitting: Pulmonary Disease

## 2017-08-13 VITALS — BP 122/70 | HR 76 | Ht 61.5 in | Wt 129.0 lb

## 2017-08-13 DIAGNOSIS — J90 Pleural effusion, not elsewhere classified: Secondary | ICD-10-CM

## 2017-08-13 DIAGNOSIS — J181 Lobar pneumonia, unspecified organism: Secondary | ICD-10-CM

## 2017-08-13 DIAGNOSIS — J189 Pneumonia, unspecified organism: Secondary | ICD-10-CM | POA: Diagnosis not present

## 2017-08-13 NOTE — Progress Notes (Signed)
Subjective:   PATIENT ID: Melanie Phelps GENDER: female DOB: Dec 21, 1950, MRN: [id number]  Synopsis: Referred in December 2018 for recent pneumonia; She has an history of ARDS in 1992 after giving birth to her twins.  HPI  Chief Complaint  Patient presents with  . Follow-up    pt states she is much improved since last ov, denies any current breathing complaints.     Melanie Phelps has been feeling well since the last visit.  She is back to exercising now and feels no shortness of breath when doing cardio work for 30 minutes at a time.  She has no cough, no weight loss, no chest pain.  No fevers or chills.  In general she feels that she is completely recovered.   History reviewed. No pertinent past medical history.    Review of Systems  HENT: Positive for congestion.   Respiratory: Positive for cough and shortness of breath.       Objective:  Physical Exam   Vitals:   08/13/17 1151  BP: 122/70  Pulse: 76  SpO2: 100%  Weight: 129 lb (58.5 kg)  Height: 5' 1.5" (1.562 m)    Gen: well appearing HENT: OP clear, TM's clear, neck supple PULM: CTA B, normal percussion CV: RRR, no mgr, trace edema GI: BS+, soft, nontender Derm: no cyanosis or rash Psyche: normal mood and affect    CBC    Component Value Date/Time   WBC 15.3 (H) 06/25/2017 1152   RBC 4.02 06/25/2017 1152   HGB 12.1 06/25/2017 1152   HCT 37.0 06/25/2017 1152   PLT 715.0 (H) 06/25/2017 1152   MCV 92.0 06/25/2017 1152   MCHC 32.8 06/25/2017 1152   RDW 13.4 06/25/2017 1152   LYMPHSABS 1.3 06/25/2017 1152   MONOABS 1.0 06/25/2017 1152   EOSABS 0.0 06/25/2017 1152   BASOSABS 0.1 06/25/2017 1152     Chest imaging: November 2018 chest x-ray images independently reviewed showing : L>R pleural effusion, cardiomegaly question infiltrate versus atelectasis right base Nov 2018 [place] chest: effusion too small to drain 06/30/17 CXR: small left effusion, images independently reviewed today with the  patient  PFT:  Labs:  Path:  Echo:  Heart Catheterization:   Record review: Records from [place] reviewed where the patient was referred to [place] in the setting of likely pneumonia and a newly discovered left-sided pleural effusion: The patient received Prevnar 13 vaccination in 2017      Assessment & Plan:   Pleural effusion  Community acquired pneumonia of left lower lobe of lung ([place])  Discussion: Staisha had community-acquired pneumonia with an uncomplicated parapneumonic effusion which appears to have resolved based on her physical exam.  Last chest x-ray showed near complete resolution.  I explained to her today that the chest x-ray was read as having "COPD".  You cannot diagnose COPD from a chest x-ray, she does not have this condition.  Fortunately pleural effusion seems to have resolved.  I explained that she may be left with some degree of pleural thickening in the left lung for the rest of her life.  Plan: Community-acquired pneumonia: Resolved  Pleural effusion: I believe this is resolved based on your physical exam We will repeat a chest x-ray today If you have shortness of breath chest pain or other respiratory symptoms please come back and see [place]      Current Outpatient Medications:  .  fluticasone (FLONASE) 50 MCG/ACT nasal spray, , Disp: , Rfl:  .  ipratropium (ATROVENT) 0.06 %  nasal spray, , Disp: , Rfl:  .  montelukast (SINGULAIR) 10 MG tablet, Take 10 mg by mouth at bedtime., Disp: , Rfl:  .  Multiple Vitamin (MULTIVITAMIN) capsule, Take by mouth., Disp: , Rfl:

## 2017-08-13 NOTE — Patient Instructions (Signed)
Community-acquired pneumonia: Resolved  Pleural effusion: I believe this is resolved based on your physical exam We will repeat a chest x-ray today If you have shortness of breath chest pain or other respiratory symptoms please come back and see [place]

## 2017-08-13 NOTE — Progress Notes (Deleted)
     Subjective:   PATIENT ID: Aniceto Boss GENDER: female DOB: 1951/04/09, MRN: [id number]  Synopsis: Referred in December 2018 for recent pneumonia; She has an history of ARDS in 1992 after giving birth to her twins.  HPI  No chief complaint on file.  ***   No past medical history on file.    Review of Systems  HENT: Positive for congestion.   Respiratory: Positive for cough and shortness of breath.       Objective:  Physical Exam   There were no vitals filed for this visit.  ***   CBC    Component Value Date/Time   WBC 15.3 (H) 06/25/2017 1152   RBC 4.02 06/25/2017 1152   HGB 12.1 06/25/2017 1152   HCT 37.0 06/25/2017 1152   PLT 715.0 (H) 06/25/2017 1152   MCV 92.0 06/25/2017 1152   MCHC 32.8 06/25/2017 1152   RDW 13.4 06/25/2017 1152   LYMPHSABS 1.3 06/25/2017 1152   MONOABS 1.0 06/25/2017 1152   EOSABS 0.0 06/25/2017 1152   BASOSABS 0.1 06/25/2017 1152     Chest imaging: November 2018 chest x-ray images independently reviewed showing : L>R pleural effusion, cardiomegaly question infiltrate versus atelectasis right base Nov 2018 [place] chest: effusion too small to drain 06/30/17 CXR: small left effusion  PFT:  Labs:  Path:  Echo:  Heart Catheterization:   Record review: Records from [place] reviewed where the patient was referred to [place] in the setting of likely pneumonia and a newly discovered left-sided pleural effusion: The patient received Prevnar 13 vaccination in 2017      Assessment & Plan:   No diagnosis found.  ***    Current Outpatient Medications:  .  fluticasone (FLONASE) 50 MCG/ACT nasal spray, , Disp: , Rfl:  .  ipratropium (ATROVENT) 0.06 % nasal spray, , Disp: , Rfl:  .  montelukast (SINGULAIR) 10 MG tablet, Take 10 mg by mouth at bedtime., Disp: , Rfl:  .  Multiple Vitamin (MULTIVITAMIN) capsule, Take by mouth., Disp: , Rfl:

## 2017-08-13 NOTE — Addendum Note (Signed)
Addended by: Len Blalock on: 08/13/2017 12:08 PM   Modules accepted: Orders

## 2017-10-29 DIAGNOSIS — H903 Sensorineural hearing loss, bilateral: Secondary | ICD-10-CM | POA: Diagnosis not present

## 2017-12-14 ENCOUNTER — Other Ambulatory Visit: Payer: Self-pay | Admitting: Family Medicine

## 2017-12-14 DIAGNOSIS — Z1231 Encounter for screening mammogram for malignant neoplasm of breast: Secondary | ICD-10-CM

## 2018-01-24 ENCOUNTER — Ambulatory Visit
Admission: RE | Admit: 2018-01-24 | Discharge: 2018-01-24 | Disposition: A | Discharge status: Home / Self Care | Payer: BLUE CROSS/BLUE SHIELD | Source: Ambulatory Visit | Attending: Family Medicine | Admitting: Family Medicine

## 2018-01-24 DIAGNOSIS — Z1231 Encounter for screening mammogram for malignant neoplasm of breast: Secondary | ICD-10-CM

## 2018-02-10 DIAGNOSIS — N390 Urinary tract infection, site not specified: Secondary | ICD-10-CM | POA: Diagnosis not present

## 2018-03-02 DIAGNOSIS — J300 Vasomotor rhinitis: Secondary | ICD-10-CM | POA: Diagnosis not present

## 2018-03-02 DIAGNOSIS — J189 Pneumonia, unspecified organism: Secondary | ICD-10-CM | POA: Diagnosis not present

## 2018-03-08 DIAGNOSIS — L814 Other melanin hyperpigmentation: Secondary | ICD-10-CM | POA: Diagnosis not present

## 2018-03-08 DIAGNOSIS — D225 Melanocytic nevi of trunk: Secondary | ICD-10-CM | POA: Diagnosis not present

## 2018-03-08 DIAGNOSIS — L821 Other seborrheic keratosis: Secondary | ICD-10-CM | POA: Diagnosis not present

## 2018-03-08 DIAGNOSIS — D1801 Hemangioma of skin and subcutaneous tissue: Secondary | ICD-10-CM | POA: Diagnosis not present

## 2018-05-22 DIAGNOSIS — Z23 Encounter for immunization: Secondary | ICD-10-CM | POA: Diagnosis not present

## 2018-06-01 DIAGNOSIS — N390 Urinary tract infection, site not specified: Secondary | ICD-10-CM | POA: Diagnosis not present

## 2018-07-29 DIAGNOSIS — E785 Hyperlipidemia, unspecified: Secondary | ICD-10-CM | POA: Diagnosis not present

## 2018-07-29 DIAGNOSIS — Z5181 Encounter for therapeutic drug level monitoring: Secondary | ICD-10-CM | POA: Diagnosis not present

## 2018-07-29 DIAGNOSIS — R7303 Prediabetes: Secondary | ICD-10-CM | POA: Diagnosis not present

## 2018-07-29 DIAGNOSIS — Z0000 Encounter for general adult medical examination without abnormal findings: Secondary | ICD-10-CM | POA: Diagnosis not present

## 2018-08-11 DIAGNOSIS — R300 Dysuria: Secondary | ICD-10-CM | POA: Diagnosis not present

## 2018-08-11 DIAGNOSIS — N390 Urinary tract infection, site not specified: Secondary | ICD-10-CM | POA: Diagnosis not present

## 2018-09-01 ENCOUNTER — Other Ambulatory Visit: Payer: Self-pay | Admitting: Podiatry

## 2018-09-01 ENCOUNTER — Ambulatory Visit: Payer: BLUE CROSS/BLUE SHIELD | Admitting: Podiatry

## 2018-09-01 ENCOUNTER — Encounter: Payer: Self-pay | Admitting: Podiatry

## 2018-09-01 ENCOUNTER — Ambulatory Visit (INDEPENDENT_AMBULATORY_CARE_PROVIDER_SITE_OTHER): Payer: BLUE CROSS/BLUE SHIELD

## 2018-09-01 VITALS — BP 131/72

## 2018-09-01 DIAGNOSIS — G5762 Lesion of plantar nerve, left lower limb: Secondary | ICD-10-CM

## 2018-09-01 DIAGNOSIS — D3610 Benign neoplasm of peripheral nerves and autonomic nervous system, unspecified: Secondary | ICD-10-CM

## 2018-09-01 DIAGNOSIS — M79672 Pain in left foot: Secondary | ICD-10-CM

## 2018-09-01 NOTE — Progress Notes (Signed)
Subjective:   Patient ID: Melanie Phelps, female   DOB: 68 y.o.   MRN: [id number]   HPI Patient states when she does a lot of walking she gets shooting pain in her left foot and she has trouble wearing certain types of shoes and they get irritated and she gets radiating discomfort into the digits and thinks mostly the second and third toes seem to be affected the worse.  Patient does not smoke and likes to be active   Review of Systems  All other systems reviewed and are negative.       Objective:  Physical Exam Vitals signs and nursing note reviewed.  Constitutional:      Appearance: She is well-developed.  Pulmonary:     Effort: Pulmonary effort is normal.  Musculoskeletal: Normal range of motion.  Skin:    General: Skin is warm.  Neurological:     Mental Status: She is alert.     Neurovascular status intact muscle strength adequate range of motion within normal limits with shooting radiating pain second interspace left foot with positive Mulder sign noted.  The third interspace does not appear to be involved and I did not note any pain in the metatarsal phalangeal joints.  Patient was found to have good digital perfusion well oriented x3     Assessment:  Probability for neuroma symptomatology left second interspace versus capsulitis     Plan:  H&P x-ray and condition reviewed and at this point I did go ahead did a sterile prep of the left forefoot I then injected the second interspace 3 mg of Kenalog with Marcaine and we will see the response and make a decision depending on response whether or not neural lysis could be considered or the consideration for surgical excision or other treatments  X-rays indicate that there is some narrowing of the second intermetatarsal space left

## 2018-09-07 DIAGNOSIS — H2513 Age-related nuclear cataract, bilateral: Secondary | ICD-10-CM | POA: Diagnosis not present

## 2018-09-07 DIAGNOSIS — H5213 Myopia, bilateral: Secondary | ICD-10-CM | POA: Diagnosis not present

## 2018-09-07 DIAGNOSIS — H401421 Capsular glaucoma with pseudoexfoliation of lens, left eye, mild stage: Secondary | ICD-10-CM | POA: Diagnosis not present

## 2018-09-07 DIAGNOSIS — H524 Presbyopia: Secondary | ICD-10-CM | POA: Diagnosis not present

## 2018-09-14 ENCOUNTER — Encounter: Payer: Self-pay | Admitting: Podiatry

## 2018-09-14 ENCOUNTER — Ambulatory Visit (INDEPENDENT_AMBULATORY_CARE_PROVIDER_SITE_OTHER): Payer: BLUE CROSS/BLUE SHIELD | Admitting: Podiatry

## 2018-09-14 DIAGNOSIS — D3610 Benign neoplasm of peripheral nerves and autonomic nervous system, unspecified: Secondary | ICD-10-CM

## 2018-09-14 DIAGNOSIS — D3612 Benign neoplasm of peripheral nerves and autonomic nervous system, upper limb, including shoulder: Secondary | ICD-10-CM | POA: Diagnosis not present

## 2018-09-17 NOTE — Progress Notes (Signed)
Subjective:   Patient ID: Melanie Phelps, female   DOB: 68 y.o.   MRN: [id number]   HPI Patient presents with continued pain at second interspace left and she like to try medication with consideration at one point this may require surgery.  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  We discussed ultimately surgery may be necessary to try this first and see the response

## 2018-10-05 ENCOUNTER — Encounter: Payer: Self-pay | Admitting: Podiatry

## 2018-10-05 ENCOUNTER — Ambulatory Visit: Payer: BLUE CROSS/BLUE SHIELD | Admitting: Podiatry

## 2018-10-05 ENCOUNTER — Other Ambulatory Visit: Payer: Self-pay

## 2018-10-05 DIAGNOSIS — G5762 Lesion of plantar nerve, left lower limb: Secondary | ICD-10-CM | POA: Diagnosis not present

## 2018-10-05 DIAGNOSIS — D3610 Benign neoplasm of peripheral nerves and autonomic nervous system, unspecified: Secondary | ICD-10-CM

## 2018-10-09 NOTE — Progress Notes (Signed)
Subjective:   Patient ID: Melanie Phelps, female   DOB: 68 y.o.   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  I did this injection after sterile prep with 3 mg Kenalog 5 mg Xylocaine and advised on wider shoes and reappoint 1 month

## 2018-11-02 ENCOUNTER — Ambulatory Visit: Payer: BLUE CROSS/BLUE SHIELD | Admitting: Podiatry

## 2018-12-07 ENCOUNTER — Ambulatory Visit: Payer: BLUE CROSS/BLUE SHIELD | Admitting: Podiatry

## 2019-03-06 DIAGNOSIS — J300 Vasomotor rhinitis: Secondary | ICD-10-CM | POA: Diagnosis not present

## 2019-03-13 DIAGNOSIS — L821 Other seborrheic keratosis: Secondary | ICD-10-CM | POA: Diagnosis not present

## 2019-03-13 DIAGNOSIS — D1801 Hemangioma of skin and subcutaneous tissue: Secondary | ICD-10-CM | POA: Diagnosis not present

## 2019-03-13 DIAGNOSIS — L814 Other melanin hyperpigmentation: Secondary | ICD-10-CM | POA: Diagnosis not present

## 2019-03-13 DIAGNOSIS — L820 Inflamed seborrheic keratosis: Secondary | ICD-10-CM | POA: Diagnosis not present

## 2019-03-13 DIAGNOSIS — D229 Melanocytic nevi, unspecified: Secondary | ICD-10-CM | POA: Diagnosis not present

## 2019-06-09 ENCOUNTER — Other Ambulatory Visit: Payer: Self-pay

## 2019-06-09 DIAGNOSIS — Z20822 Contact with and (suspected) exposure to covid-19: Secondary | ICD-10-CM

## 2019-06-12 LAB — NOVEL CORONAVIRUS, NAA: SARS-CoV-2, NAA: NOT DETECTED

## 2019-08-08 ENCOUNTER — Ambulatory Visit: Payer: BC Managed Care – PPO | Attending: Internal Medicine

## 2019-08-08 DIAGNOSIS — Z23 Encounter for immunization: Secondary | ICD-10-CM | POA: Diagnosis not present

## 2019-08-08 NOTE — Progress Notes (Signed)
   Covid-19 Vaccination Clinic  Name:  Melanie Phelps    MRN: [id number] DOB: [birth year]  08/08/2019  Melanie Phelps was observed post Covid-19 immunization for 15 minutes without incidence. She was provided with Vaccine Information Sheet and instruction to access the V-Safe system.   Melanie Phelps was instructed to call 911 with any severe reactions post vaccine: Marland Kitchen Difficulty breathing  . Swelling of your face and throat  . A fast heartbeat  . A bad rash all over your body  . Dizziness and weakness    Immunizations Administered    Name Date Dose VIS Date Route   Pfizer COVID-19 Vaccine 08/08/2019  2:08 PM 0.3 mL 06/30/2019 Intramuscular   Manufacturer: [place]   Lot: [id number]   [place]: [id number]

## 2019-08-14 ENCOUNTER — Other Ambulatory Visit: Payer: Self-pay | Admitting: Family Medicine

## 2019-08-14 DIAGNOSIS — Z0000 Encounter for general adult medical examination without abnormal findings: Secondary | ICD-10-CM | POA: Diagnosis not present

## 2019-08-14 DIAGNOSIS — Z1231 Encounter for screening mammogram for malignant neoplasm of breast: Secondary | ICD-10-CM

## 2019-08-29 ENCOUNTER — Ambulatory Visit: Payer: BC Managed Care – PPO

## 2019-08-29 ENCOUNTER — Ambulatory Visit: Payer: BC Managed Care – PPO | Attending: Internal Medicine

## 2019-08-29 DIAGNOSIS — Z23 Encounter for immunization: Secondary | ICD-10-CM | POA: Insufficient documentation

## 2019-08-29 NOTE — Progress Notes (Signed)
   Covid-19 Vaccination Clinic  Name:  JAYANA SCHALOW    MRN: [id number] DOB: [birth year]  08/29/2019  Ms. Lockrem was observed post Covid-19 immunization for 15 minutes without incidence. She was provided with Vaccine Information Sheet and instruction to access the V-Safe system.   Ms. Moss was instructed to call 911 with any severe reactions post vaccine: Marland Kitchen Difficulty breathing  . Swelling of your face and throat  . A fast heartbeat  . A bad rash all over your body  . Dizziness and weakness    Immunizations Administered    Name Date Dose VIS Date Route   Pfizer COVID-19 Vaccine 08/29/2019  8:38 AM 0.3 mL 06/30/2019 Intramuscular   Manufacturer: [place]   Lot: [id number]   [place]: [id number]

## 2019-09-13 DIAGNOSIS — H401421 Capsular glaucoma with pseudoexfoliation of lens, left eye, mild stage: Secondary | ICD-10-CM | POA: Diagnosis not present

## 2019-09-13 DIAGNOSIS — H2513 Age-related nuclear cataract, bilateral: Secondary | ICD-10-CM | POA: Diagnosis not present

## 2019-09-13 DIAGNOSIS — H5213 Myopia, bilateral: Secondary | ICD-10-CM | POA: Diagnosis not present

## 2019-09-20 ENCOUNTER — Other Ambulatory Visit: Payer: Self-pay

## 2019-09-20 ENCOUNTER — Ambulatory Visit
Admission: RE | Admit: 2019-09-20 | Discharge: 2019-09-20 | Disposition: A | Discharge status: Home / Self Care | Payer: BC Managed Care – PPO | Source: Ambulatory Visit | Attending: Family Medicine | Admitting: Family Medicine

## 2019-09-20 DIAGNOSIS — Z1231 Encounter for screening mammogram for malignant neoplasm of breast: Secondary | ICD-10-CM

## 2019-10-11 ENCOUNTER — Ambulatory Visit (INDEPENDENT_AMBULATORY_CARE_PROVIDER_SITE_OTHER): Payer: BC Managed Care – PPO

## 2019-10-11 ENCOUNTER — Ambulatory Visit (INDEPENDENT_AMBULATORY_CARE_PROVIDER_SITE_OTHER): Payer: BC Managed Care – PPO | Admitting: Podiatry

## 2019-10-11 ENCOUNTER — Other Ambulatory Visit: Payer: Self-pay

## 2019-10-11 ENCOUNTER — Encounter: Payer: Self-pay | Admitting: Podiatry

## 2019-10-11 ENCOUNTER — Other Ambulatory Visit: Payer: Self-pay | Admitting: Podiatry

## 2019-10-11 VITALS — Temp 98.2°F

## 2019-10-11 DIAGNOSIS — M79672 Pain in left foot: Secondary | ICD-10-CM

## 2019-10-11 DIAGNOSIS — D3610 Benign neoplasm of peripheral nerves and autonomic nervous system, unspecified: Secondary | ICD-10-CM

## 2019-10-11 DIAGNOSIS — M778 Other enthesopathies, not elsewhere classified: Secondary | ICD-10-CM

## 2019-10-11 NOTE — Patient Instructions (Signed)
Pre-Operative Instructions  Congratulations, you have decided to take an important step towards improving your quality of life.  You can be assured that the doctors and staff at [facility] will be with you every step of the way.  Here are some important things you should know:  1. Plan to be at the surgery center/hospital at least 1 (one) hour prior to your scheduled time, unless otherwise directed by the surgical center/hospital staff.  You must have a responsible adult accompany you, remain during the surgery and drive you home.  Make sure you have directions to the surgical center/hospital to ensure you arrive on time. 2. If you are having surgery at [facility] or [facility], you will need a copy of your medical history and physical form from your family physician within one month prior to the date of surgery. We will give you a form for your primary physician to complete.  3. We make every effort to accommodate the date you request for surgery.  However, there are times where surgery dates or times have to be moved.  We will contact you as soon as possible if a change in schedule is required.   4. No aspirin/ibuprofen for one week before surgery.  If you are on aspirin, any non-steroidal anti-inflammatory medications (Mobic, Aleve, Ibuprofen) should not be taken seven (7) days prior to your surgery.  You make take Tylenol for pain prior to surgery.  5. Medications - If you are taking daily heart and blood pressure medications, seizure, reflux, allergy, asthma, anxiety, pain or diabetes medications, make sure you notify the surgery center/hospital before the day of surgery so they can tell you which medications you should take or avoid the day of surgery. 6. No food or drink after midnight the night before surgery unless directed otherwise by surgical center/hospital staff. 7. No alcoholic beverages 24-hours prior to surgery.  No smoking 24-hours prior or 24-hours after  surgery. 8. Wear loose pants or shorts. They should be loose enough to fit over bandages, boots, and casts. 9. Don't wear slip-on shoes. Sneakers are preferred. 10. Bring your boot with you to the surgery center/hospital.  Also bring crutches or a walker if your physician has prescribed it for you.  If you do not have this equipment, it will be provided for you after surgery. 11. If you have not been contacted by the surgery center/hospital by the day before your surgery, call to confirm the date and time of your surgery. 12. Leave-time from work may vary depending on the type of surgery you have.  Appropriate arrangements should be made prior to surgery with your employer. 13. Prescriptions will be provided immediately following surgery by your doctor.  Fill these as soon as possible after surgery and take the medication as directed. Pain medications will not be refilled on weekends and must be approved by the doctor. 14. Remove nail polish on the operative foot and avoid getting pedicures prior to surgery. 15. Wash the night before surgery.  The night before surgery wash the foot and leg well with water and the antibacterial soap provided. Be sure to pay special attention to beneath the toenails and in between the toes.  Wash for at least three (3) minutes. Rinse thoroughly with water and dry well with a towel.  Perform this wash unless told not to do so by your physician.  Enclosed: 1 Ice pack (please put in freezer the night before surgery)   1 Hibiclens skin cleaner     Pre-op instructions  If you have any questions regarding the instructions, please do not hesitate to call our office.  [place]: [address], [place], [place] [address] -- [phone]  [place]: [address] [place], [place] [address] -- [phone]  [place]: [address], [place],  [address] -- [phone]   Website: [email or web address] 

## 2019-10-11 NOTE — Progress Notes (Signed)
Subjective:   Patient ID: Melanie Phelps, female   DOB: 69 y.o.   MRN: [id number]   HPI Patient states my nerve never really recovered but because of Covid I did not come in and I want it fixed   ROS      Objective:  Physical Exam  Neurovascular status intact with patient found to have inflammation pain second interspace left with shooting discomfort into the adjacent digits     Assessment:  Neuroma symptomatology second left with pain     Plan:  H&P condition reviewed and I recommended surgical intervention I allowed her to read consent form going over procedure and risk and the fact there is no long-term guarantees and all complications can occur.  Patient signed consent form scheduled for outpatient surgery and is encouraged to call with questions prior to procedure and understands recovery takes several months for this particular procedure  X-rays were negative for signs of bone changes from previous visit or other pathology

## 2019-10-12 ENCOUNTER — Telehealth: Payer: Self-pay | Admitting: Podiatry

## 2019-10-12 NOTE — Telephone Encounter (Signed)
I was in and saw Dr. Paulla Dolly yesterday. He scheduled me for surgery on 04/27 and I was wondering if I could move that to the following week, 05/04? I rescheduled pt's sx date and told her to call me if she needs anything between now and her surgery.

## 2019-10-31 ENCOUNTER — Telehealth: Payer: Self-pay

## 2019-10-31 NOTE — Telephone Encounter (Signed)
DOS 05/04/201  NEURECTOMY 2ND LT - 28080  MEDICARE/ANTHEM [employer]BS  PER AIM WEBSITE CPT 28080 DOES NOT REQUIRE AUTH.

## 2019-11-21 ENCOUNTER — Encounter: Payer: Self-pay | Admitting: Podiatry

## 2019-11-21 DIAGNOSIS — T8734 Neuroma of amputation stump, left lower extremity: Secondary | ICD-10-CM | POA: Diagnosis not present

## 2019-11-21 DIAGNOSIS — G5762 Lesion of plantar nerve, left lower limb: Secondary | ICD-10-CM | POA: Diagnosis not present

## 2019-11-21 DIAGNOSIS — Z9851 Tubal ligation status: Secondary | ICD-10-CM | POA: Diagnosis not present

## 2019-11-21 DIAGNOSIS — G5760 Lesion of plantar nerve, unspecified lower limb: Secondary | ICD-10-CM | POA: Diagnosis not present

## 2019-11-27 ENCOUNTER — Ambulatory Visit (INDEPENDENT_AMBULATORY_CARE_PROVIDER_SITE_OTHER): Payer: BC Managed Care – PPO | Admitting: Podiatry

## 2019-11-27 ENCOUNTER — Encounter: Payer: Self-pay | Admitting: Podiatry

## 2019-11-27 ENCOUNTER — Other Ambulatory Visit: Payer: Self-pay

## 2019-11-27 VITALS — Temp 98.5°F

## 2019-11-27 DIAGNOSIS — D3610 Benign neoplasm of peripheral nerves and autonomic nervous system, unspecified: Secondary | ICD-10-CM | POA: Diagnosis not present

## 2019-11-29 ENCOUNTER — Encounter: Payer: Self-pay | Admitting: Podiatry

## 2019-11-30 NOTE — Progress Notes (Signed)
Subjective:   Patient ID: Melanie Phelps, female   DOB: 69 y.o.   MRN: [id number]   HPI Patient states she is doing very well with surgery and having minimal discomfort   ROS      Objective:  Physical Exam  Neurovascular status intact incision site second interspace left healing well wound edges well coapted negative Bevelyn Buckles' sign noted     Assessment:  Doing well post neurectomy second interspace left     Plan:  Reviewed condition and reapplied sterile dressing continue elevation compression and dispensed ankle compression stocking.  Patient may gradually increase activities and get her foot wet and she is encouraged to call us with any questions concerns which may arise

## 2019-12-15 ENCOUNTER — Encounter: Payer: Medicare Other | Admitting: Podiatry

## 2020-03-04 DIAGNOSIS — J300 Vasomotor rhinitis: Secondary | ICD-10-CM | POA: Diagnosis not present

## 2020-03-12 DIAGNOSIS — L905 Scar conditions and fibrosis of skin: Secondary | ICD-10-CM | POA: Diagnosis not present

## 2020-03-12 DIAGNOSIS — L821 Other seborrheic keratosis: Secondary | ICD-10-CM | POA: Diagnosis not present

## 2020-03-12 DIAGNOSIS — L570 Actinic keratosis: Secondary | ICD-10-CM | POA: Diagnosis not present

## 2020-03-12 DIAGNOSIS — L814 Other melanin hyperpigmentation: Secondary | ICD-10-CM | POA: Diagnosis not present

## 2020-03-12 DIAGNOSIS — D225 Melanocytic nevi of trunk: Secondary | ICD-10-CM | POA: Diagnosis not present

## 2020-04-22 DIAGNOSIS — S8001XA Contusion of right knee, initial encounter: Secondary | ICD-10-CM | POA: Diagnosis not present

## 2020-04-22 DIAGNOSIS — W109XXA Fall (on) (from) unspecified stairs and steps, initial encounter: Secondary | ICD-10-CM | POA: Diagnosis not present

## 2020-04-22 DIAGNOSIS — S91112A Laceration without foreign body of left great toe without damage to nail, initial encounter: Secondary | ICD-10-CM | POA: Diagnosis not present

## 2020-08-06 DIAGNOSIS — R058 Other specified cough: Secondary | ICD-10-CM | POA: Diagnosis not present

## 2020-08-06 DIAGNOSIS — Z1152 Encounter for screening for COVID-19: Secondary | ICD-10-CM | POA: Diagnosis not present

## 2020-08-20 DIAGNOSIS — R059 Cough, unspecified: Secondary | ICD-10-CM | POA: Diagnosis not present

## 2020-08-30 ENCOUNTER — Ambulatory Visit
Admission: RE | Admit: 2020-08-30 | Discharge: 2020-08-30 | Disposition: A | Discharge status: Home / Self Care | Payer: BC Managed Care – PPO | Source: Ambulatory Visit | Attending: Family Medicine | Admitting: Family Medicine

## 2020-08-30 ENCOUNTER — Other Ambulatory Visit: Payer: Self-pay | Admitting: Family Medicine

## 2020-08-30 DIAGNOSIS — R059 Cough, unspecified: Secondary | ICD-10-CM

## 2020-09-16 DIAGNOSIS — H5213 Myopia, bilateral: Secondary | ICD-10-CM | POA: Diagnosis not present

## 2020-09-16 DIAGNOSIS — H2513 Age-related nuclear cataract, bilateral: Secondary | ICD-10-CM | POA: Diagnosis not present

## 2020-09-16 DIAGNOSIS — H401421 Capsular glaucoma with pseudoexfoliation of lens, left eye, mild stage: Secondary | ICD-10-CM | POA: Diagnosis not present

## 2020-09-25 DIAGNOSIS — Z5181 Encounter for therapeutic drug level monitoring: Secondary | ICD-10-CM | POA: Diagnosis not present

## 2020-09-25 DIAGNOSIS — R7303 Prediabetes: Secondary | ICD-10-CM | POA: Diagnosis not present

## 2020-09-25 DIAGNOSIS — Z0000 Encounter for general adult medical examination without abnormal findings: Secondary | ICD-10-CM | POA: Diagnosis not present

## 2020-09-25 DIAGNOSIS — E785 Hyperlipidemia, unspecified: Secondary | ICD-10-CM | POA: Diagnosis not present

## 2020-11-04 DIAGNOSIS — L814 Other melanin hyperpigmentation: Secondary | ICD-10-CM | POA: Diagnosis not present

## 2020-11-04 DIAGNOSIS — L821 Other seborrheic keratosis: Secondary | ICD-10-CM | POA: Diagnosis not present

## 2020-11-04 DIAGNOSIS — L905 Scar conditions and fibrosis of skin: Secondary | ICD-10-CM | POA: Diagnosis not present

## 2020-11-04 DIAGNOSIS — D229 Melanocytic nevi, unspecified: Secondary | ICD-10-CM | POA: Diagnosis not present

## 2020-11-04 DIAGNOSIS — L570 Actinic keratosis: Secondary | ICD-10-CM | POA: Diagnosis not present

## 2021-01-09 ENCOUNTER — Other Ambulatory Visit: Payer: Self-pay | Admitting: Family Medicine

## 2021-01-09 DIAGNOSIS — Z1231 Encounter for screening mammogram for malignant neoplasm of breast: Secondary | ICD-10-CM

## 2021-01-28 DIAGNOSIS — H00014 Hordeolum externum left upper eyelid: Secondary | ICD-10-CM | POA: Diagnosis not present

## 2021-03-04 DIAGNOSIS — J300 Vasomotor rhinitis: Secondary | ICD-10-CM | POA: Diagnosis not present

## 2021-03-04 DIAGNOSIS — J189 Pneumonia, unspecified organism: Secondary | ICD-10-CM | POA: Diagnosis not present

## 2021-03-05 ENCOUNTER — Other Ambulatory Visit: Payer: Self-pay

## 2021-03-05 ENCOUNTER — Ambulatory Visit
Admission: RE | Admit: 2021-03-05 | Discharge: 2021-03-05 | Disposition: A | Discharge status: Home / Self Care | Payer: BC Managed Care – PPO | Source: Ambulatory Visit

## 2021-03-05 DIAGNOSIS — Z1231 Encounter for screening mammogram for malignant neoplasm of breast: Secondary | ICD-10-CM

## 2021-06-06 DIAGNOSIS — U071 COVID-19: Secondary | ICD-10-CM | POA: Diagnosis not present

## 2021-09-17 DIAGNOSIS — H524 Presbyopia: Secondary | ICD-10-CM | POA: Diagnosis not present

## 2021-09-17 DIAGNOSIS — H401421 Capsular glaucoma with pseudoexfoliation of lens, left eye, mild stage: Secondary | ICD-10-CM | POA: Diagnosis not present

## 2021-09-17 DIAGNOSIS — H2513 Age-related nuclear cataract, bilateral: Secondary | ICD-10-CM | POA: Diagnosis not present

## 2021-10-15 ENCOUNTER — Other Ambulatory Visit (HOSPITAL_COMMUNITY): Payer: Self-pay | Admitting: Family Medicine

## 2021-10-15 DIAGNOSIS — Z79899 Other long term (current) drug therapy: Secondary | ICD-10-CM | POA: Diagnosis not present

## 2021-10-15 DIAGNOSIS — E785 Hyperlipidemia, unspecified: Secondary | ICD-10-CM | POA: Diagnosis not present

## 2021-10-15 DIAGNOSIS — R7303 Prediabetes: Secondary | ICD-10-CM

## 2021-10-15 DIAGNOSIS — Z0000 Encounter for general adult medical examination without abnormal findings: Secondary | ICD-10-CM | POA: Diagnosis not present

## 2021-10-20 ENCOUNTER — Other Ambulatory Visit: Payer: Self-pay | Admitting: Family Medicine

## 2021-10-20 DIAGNOSIS — E2839 Other primary ovarian failure: Secondary | ICD-10-CM

## 2021-10-30 ENCOUNTER — Ambulatory Visit (HOSPITAL_COMMUNITY)
Admission: RE | Admit: 2021-10-30 | Discharge: 2021-10-30 | Disposition: A | Discharge status: Home / Self Care | Payer: Self-pay | Source: Ambulatory Visit | Attending: Family Medicine | Admitting: Family Medicine

## 2021-10-30 DIAGNOSIS — R7303 Prediabetes: Secondary | ICD-10-CM | POA: Insufficient documentation

## 2021-11-03 DIAGNOSIS — B078 Other viral warts: Secondary | ICD-10-CM | POA: Diagnosis not present

## 2021-11-03 DIAGNOSIS — D225 Melanocytic nevi of trunk: Secondary | ICD-10-CM | POA: Diagnosis not present

## 2021-11-03 DIAGNOSIS — L570 Actinic keratosis: Secondary | ICD-10-CM | POA: Diagnosis not present

## 2021-11-03 DIAGNOSIS — L814 Other melanin hyperpigmentation: Secondary | ICD-10-CM | POA: Diagnosis not present

## 2021-11-03 DIAGNOSIS — L0889 Other specified local infections of the skin and subcutaneous tissue: Secondary | ICD-10-CM | POA: Diagnosis not present

## 2021-11-03 DIAGNOSIS — L821 Other seborrheic keratosis: Secondary | ICD-10-CM | POA: Diagnosis not present

## 2021-11-27 DIAGNOSIS — N3000 Acute cystitis without hematuria: Secondary | ICD-10-CM | POA: Diagnosis not present

## 2022-01-01 DIAGNOSIS — Z23 Encounter for immunization: Secondary | ICD-10-CM | POA: Diagnosis not present

## 2022-01-09 DIAGNOSIS — J300 Vasomotor rhinitis: Secondary | ICD-10-CM | POA: Diagnosis not present

## 2022-02-05 ENCOUNTER — Other Ambulatory Visit: Payer: Self-pay | Admitting: Family Medicine

## 2022-02-05 DIAGNOSIS — Z1231 Encounter for screening mammogram for malignant neoplasm of breast: Secondary | ICD-10-CM

## 2022-03-09 ENCOUNTER — Ambulatory Visit
Admission: RE | Admit: 2022-03-09 | Discharge: 2022-03-09 | Disposition: A | Discharge status: Home / Self Care | Payer: BC Managed Care – PPO | Source: Ambulatory Visit | Attending: Family Medicine | Admitting: Family Medicine

## 2022-03-09 DIAGNOSIS — Z1231 Encounter for screening mammogram for malignant neoplasm of breast: Secondary | ICD-10-CM | POA: Diagnosis not present

## 2022-03-11 ENCOUNTER — Other Ambulatory Visit: Payer: Self-pay | Admitting: Family Medicine

## 2022-03-11 DIAGNOSIS — R928 Other abnormal and inconclusive findings on diagnostic imaging of breast: Secondary | ICD-10-CM

## 2022-03-19 ENCOUNTER — Ambulatory Visit
Admission: RE | Admit: 2022-03-19 | Discharge: 2022-03-19 | Disposition: A | Discharge status: Home / Self Care | Payer: BC Managed Care – PPO | Source: Ambulatory Visit | Attending: Family Medicine | Admitting: Family Medicine

## 2022-03-19 DIAGNOSIS — N6489 Other specified disorders of breast: Secondary | ICD-10-CM | POA: Diagnosis not present

## 2022-03-19 DIAGNOSIS — R928 Other abnormal and inconclusive findings on diagnostic imaging of breast: Secondary | ICD-10-CM

## 2022-04-20 ENCOUNTER — Ambulatory Visit
Admission: RE | Admit: 2022-04-20 | Discharge: 2022-04-20 | Disposition: A | Discharge status: Home / Self Care | Payer: BC Managed Care – PPO | Source: Ambulatory Visit | Attending: Family Medicine | Admitting: Family Medicine

## 2022-04-20 DIAGNOSIS — E2839 Other primary ovarian failure: Secondary | ICD-10-CM

## 2022-04-29 DIAGNOSIS — R7303 Prediabetes: Secondary | ICD-10-CM | POA: Diagnosis not present

## 2022-04-29 DIAGNOSIS — Z79899 Other long term (current) drug therapy: Secondary | ICD-10-CM | POA: Diagnosis not present

## 2022-05-25 DIAGNOSIS — Z713 Dietary counseling and surveillance: Secondary | ICD-10-CM | POA: Diagnosis not present

## 2022-05-25 DIAGNOSIS — R7303 Prediabetes: Secondary | ICD-10-CM | POA: Diagnosis not present

## 2022-05-25 DIAGNOSIS — E785 Hyperlipidemia, unspecified: Secondary | ICD-10-CM | POA: Diagnosis not present

## 2022-08-16 IMAGING — CT CT CARDIAC CORONARY ARTERY CALCIUM SCORE
1 of 6 series · 7 of 20 positions shown, 9 images · non-contrast
Comparison: Chest radiograph 08/13/2017

Addendum:
CLINICAL DATA: Cardiovascular Disease Risk stratification

EXAM:
Coronary Calcium Score
TECHNIQUE: A gated, non-contrast computed tomography scan of the heart was
performed using 3mm slice thickness. Axial images were analyzed on a
dedicated workstation. Calcium scoring of the coronary arteries was
performed using the Agatston method.

[Series 9: ax thins fov · axial · 0.70mm/px · z∈[+984,+1085]mm · 7 of 193 slices shown, 9 images]
[im 25/193  vessel]
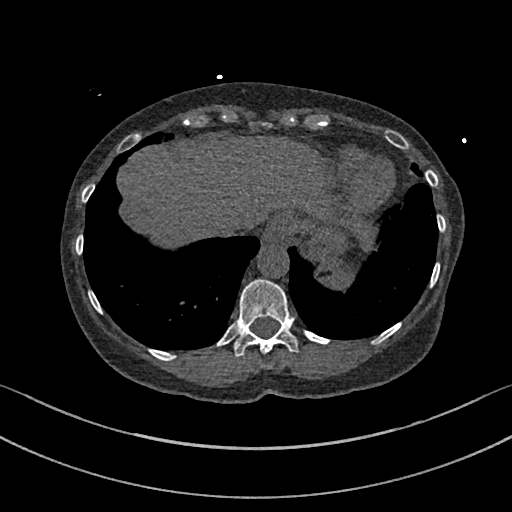
[im 25/193  lung]
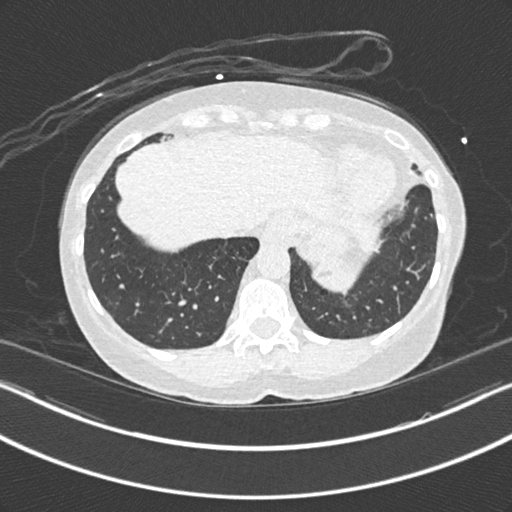
[im 49/193  vessel]
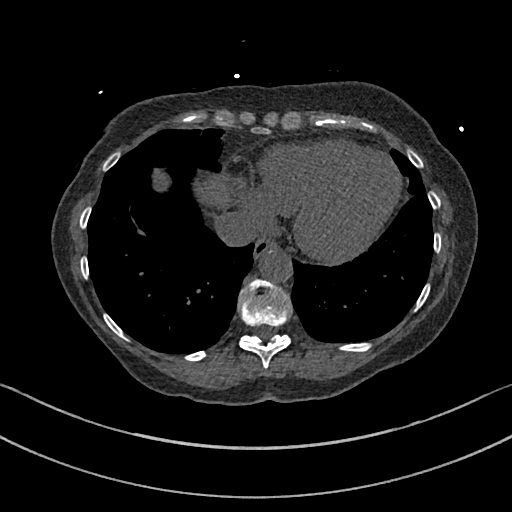
[im 73/193  vessel]
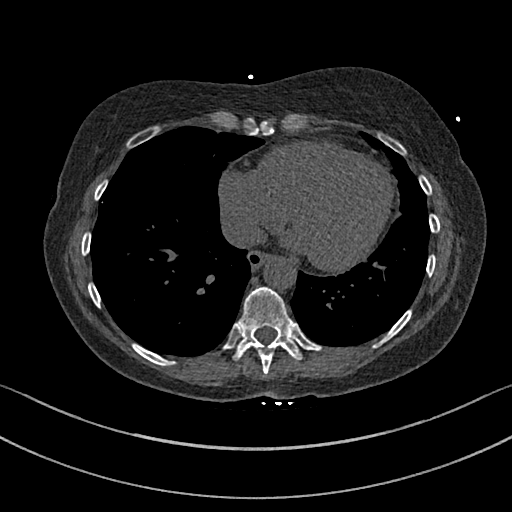
[im 97/193  vessel]
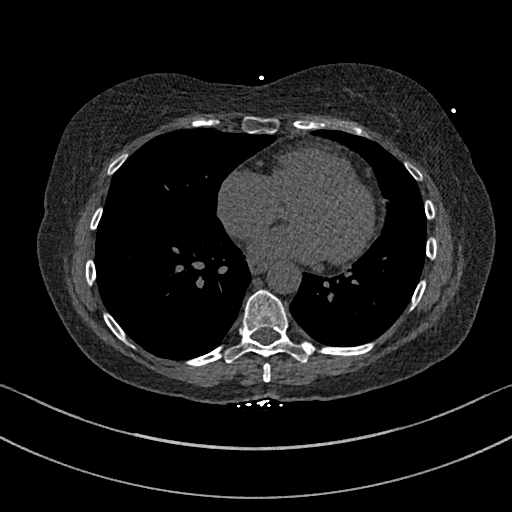
[im 121/193  vessel]
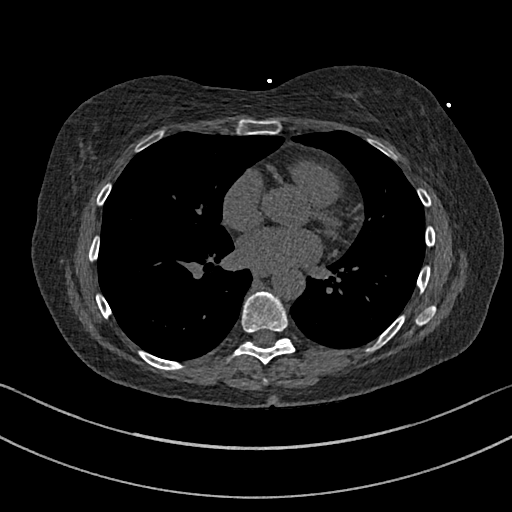
[im 121/193  lung]
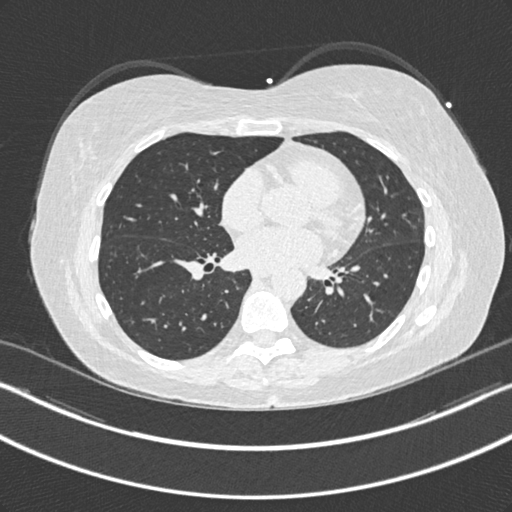
[im 145/193  vessel]
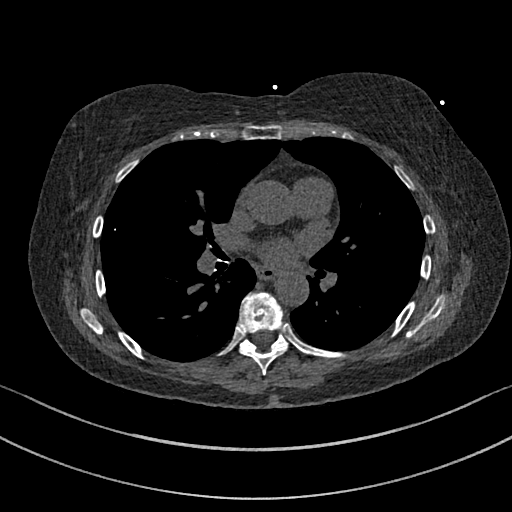
[im 169/193  vessel]
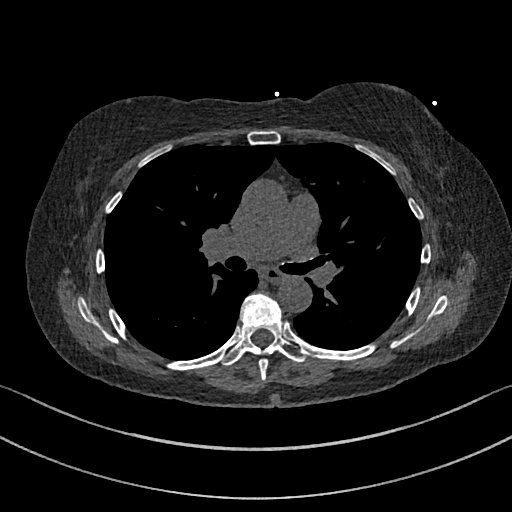

[7 of 20 positions shown; findings below may reference images not displayed]

FINDINGS: Coronary arteries: Normal origins.

Coronary Calcium Score:

Left main: 0

Left anterior descending artery: 0

Left circumflex artery: 0

Right coronary artery: 0

Total: 0

Percentile: 0

Pericardium: Normal.

Ascending Aorta: Normal caliber. Scattered calcifications in the
descending aorta.

Non-cardiac: See separate report from [REDACTED].
IMPRESSION: Coronary calcium score of 0. This was 0 percentile for age-, race-,
and sex-matched controls.



If CAC=0, it is reasonable to withhold statin therapy and reassess
in 5 to 10 years, as long as higher risk conditions are absent
(diabetes mellitus, family history of premature CHD in first degree
relatives (males <55 years; females <65 years), cigarette smoking,
or LDL >=190 mg/dL).

If CAC is 1 to 99, it is reasonable to initiate statin therapy for
patients >=55 years of age.

If CAC is >=100 or >=75th percentile, it is reasonable to initiate
statin therapy at any age.

Cardiology referral should be considered for patients with CAC
scores >=400 or >=75th percentile.

*9862 AHA/ACC/AACVPR/AAPA/ABC/ENGLER/KAYLENE/ABRAHAM KHAN/Yanki/HOLUB/AGILAR/MIKEE
Guideline on the Management of Blood Cholesterol: A Report of the
American College of Cardiology/American Heart Association Task Force
on Clinical Practice Guidelines. J Am Coll Cardiol.
7440;73(24):8296-8652.

EXAM:
OVER-READ INTERPRETATION  CT CHEST

The following report is an over-read performed by radiologist Dr.
does not include interpretation of cardiac or coronary anatomy or
pathology. The coronary calcium score interpretation by the
cardiologist is attached.
FINDINGS: Normal caliber of the visualized thoracic aorta. Heart size is
normal. Visualized mediastinal structures are normal. Images of the
upper abdomen are unremarkable. 3 mm peripheral nodule in the right
lower lobe on sequence 8 image 30. 4 mm nodular density in left
lower lobe on sequence 8 image 28. At least 2 punctate nodules in
the right lung. Focal densities in the right upper lobe near the
right minor fissure are suggestive for atelectasis or scarring. No
acute bone abnormality.
IMPRESSION: 1. No acute extracardiac findings.
2. Small pulmonary nodules, largest measuring 4 mm. These small
nodules are indeterminate. No follow-up needed if patient is
low-risk (and has no known or suspected primary neoplasm).
Non-contrast chest CT can be considered in 12 months if patient is
high-risk. This recommendation follows the consensus statement:
Guidelines for Management of Incidental Pulmonary Nodules Detected
[DATE].

*** End of Addendum ***
FINDINGS: Coronary arteries: Normal origins.

Coronary Calcium Score:

Left main: 0

Left anterior descending artery: 0

Left circumflex artery: 0

Right coronary artery: 0

Total: 0

Percentile: 0

Pericardium: Normal.

Ascending Aorta: Normal caliber. Scattered calcifications in the
descending aorta.

Non-cardiac: See separate report from [REDACTED].
IMPRESSION: Coronary calcium score of 0. This was 0 percentile for age-, race-,
and sex-matched controls.



If CAC=0, it is reasonable to withhold statin therapy and reassess
in 5 to 10 years, as long as higher risk conditions are absent
(diabetes mellitus, family history of premature CHD in first degree
relatives (males <55 years; females <65 years), cigarette smoking,
or LDL >=190 mg/dL).

If CAC is 1 to 99, it is reasonable to initiate statin therapy for
patients >=55 years of age.

If CAC is >=100 or >=75th percentile, it is reasonable to initiate
statin therapy at any age.

Cardiology referral should be considered for patients with CAC
scores >=400 or >=75th percentile.

*9862 AHA/ACC/AACVPR/AAPA/ABC/ENGLER/KAYLENE/ABRAHAM KHAN/Yanki/HOLUB/AGILAR/MIKEE
Guideline on the Management of Blood Cholesterol: A Report of the
American College of Cardiology/American Heart Association Task Force
on Clinical Practice Guidelines. J Am Coll Cardiol.
7440;73(24):8296-8652.

## 2022-09-17 DIAGNOSIS — K648 Other hemorrhoids: Secondary | ICD-10-CM | POA: Diagnosis not present

## 2022-09-17 DIAGNOSIS — D125 Benign neoplasm of sigmoid colon: Secondary | ICD-10-CM | POA: Diagnosis not present

## 2022-09-17 DIAGNOSIS — D120 Benign neoplasm of cecum: Secondary | ICD-10-CM | POA: Diagnosis not present

## 2022-09-17 DIAGNOSIS — Z86010 Personal history of colonic polyps: Secondary | ICD-10-CM | POA: Diagnosis not present

## 2022-09-17 DIAGNOSIS — Z09 Encounter for follow-up examination after completed treatment for conditions other than malignant neoplasm: Secondary | ICD-10-CM | POA: Diagnosis not present

## 2022-09-17 DIAGNOSIS — D121 Benign neoplasm of appendix: Secondary | ICD-10-CM | POA: Diagnosis not present

## 2022-09-17 DIAGNOSIS — K5730 Diverticulosis of large intestine without perforation or abscess without bleeding: Secondary | ICD-10-CM | POA: Diagnosis not present

## 2022-09-30 ENCOUNTER — Telehealth: Payer: Self-pay | Admitting: Gastroenterology

## 2022-09-30 DIAGNOSIS — Z86010 Personal history of colonic polyps: Secondary | ICD-10-CM

## 2022-09-30 DIAGNOSIS — D369 Benign neoplasm, unspecified site: Secondary | ICD-10-CM

## 2022-09-30 NOTE — Telephone Encounter (Signed)
Dr. Rush Landmark,  Please see referral from Dr. Michail Sermon.  Appendiceal orifice was a tubular adenoma in biopsy: tubular adenoma in cecum: Sigmoid biopsy was a tubular adenoma--needs EMR. Mailed color copy of colon report.   Referral placed on your desk for review.  Thank you, Colletta [place]

## 2022-09-30 NOTE — Telephone Encounter (Signed)
Please let the referring provider office know that I will not review this until some point mid next week when I return to clinic. As well, any procedures are scheduling out in May and June and if this is not felt to be appropriate, then they need to refer this patient elsewhere. Keep me up-to-date. GM

## 2022-10-01 NOTE — Telephone Encounter (Signed)
Left message for patient

## 2022-10-12 NOTE — Telephone Encounter (Signed)
Left message for patient asking for return call. I have also left message with Triage Nurse at [facility] with information regarding referral.

## 2022-10-20 NOTE — Telephone Encounter (Signed)
Review of records to be scanned into the chart   September 17, 2022 colonoscopy for history of previous colon polyps 1, 10 mm polyp at the appendiceal orifice.  The polyp was sessile and multilobulated.  Biopsied. 1, 2 mm polyp in the cecum, removed with cold biopsy forceps.  Resected. Rule out malignancy, polypoid lesion in the sigmoid colon.  30 mm polypoid lesion.  Biopsied.  Tattooed. Diverticulosis in the entire colon. Internal hemorrhoids.  Pathology Large intestine appendiceal orifice polypectomy-sessile serrated adenoma Large intestine cecum polypectomy tubular adenoma Large intestine sigmoid colon biopsy-fragments of tubular adenoma.  This patient may be scheduled for clinic visit and 90-minute colonoscopy with EMR.  If she wants to wait until clinic that is okay as well. Please update Dr. Michail Sermon at night when the patient has been scheduled. Thanks. GM

## 2022-10-21 ENCOUNTER — Other Ambulatory Visit: Payer: Self-pay

## 2022-10-21 DIAGNOSIS — Z79899 Other long term (current) drug therapy: Secondary | ICD-10-CM | POA: Diagnosis not present

## 2022-10-21 DIAGNOSIS — Z0000 Encounter for general adult medical examination without abnormal findings: Secondary | ICD-10-CM | POA: Diagnosis not present

## 2022-10-21 DIAGNOSIS — R7303 Prediabetes: Secondary | ICD-10-CM | POA: Diagnosis not present

## 2022-10-21 DIAGNOSIS — E785 Hyperlipidemia, unspecified: Secondary | ICD-10-CM | POA: Diagnosis not present

## 2022-10-21 DIAGNOSIS — Z860100 Personal history of colon polyps, unspecified: Secondary | ICD-10-CM

## 2022-10-21 MED ORDER — NA SULFATE-K SULFATE-MG SULF 17.5-3.13-1.6 GM/177ML PO SOLN: 1.0000 | ORAL | 0 refills | Status: DC

## 2022-10-21 NOTE — Addendum Note (Signed)
Addended by: Debbe Mounts on: 10/21/2022 04:31 PM   Modules accepted: Orders

## 2022-10-21 NOTE — Addendum Note (Signed)
Addended by: Debbe Mounts on: 10/21/2022 04:32 PM   Modules accepted: Orders

## 2022-10-21 NOTE — Telephone Encounter (Addendum)
Left message

## 2022-10-21 NOTE — Telephone Encounter (Signed)
Thank you Rovonda for your help. GM

## 2022-10-21 NOTE — Telephone Encounter (Addendum)
FYI- Dr.Mansouraty and Dr.Schooler   Patient has been scheduled for Colon +EMR on 12/09/22 @ [facility]. Patient has office appointment on 12/01/22 with Dr.Mansouraty. Patient informed and all instructions have been sent to patient.

## 2022-12-01 ENCOUNTER — Encounter: Payer: Self-pay | Admitting: Gastroenterology

## 2022-12-01 ENCOUNTER — Ambulatory Visit (INDEPENDENT_AMBULATORY_CARE_PROVIDER_SITE_OTHER): Payer: BC Managed Care – PPO | Admitting: Gastroenterology

## 2022-12-01 ENCOUNTER — Encounter (HOSPITAL_COMMUNITY): Payer: Self-pay | Admitting: Gastroenterology

## 2022-12-01 VITALS — BP 132/70 | HR 64 | Ht 60.5 in | Wt 142.0 lb

## 2022-12-01 DIAGNOSIS — K635 Polyp of colon: Secondary | ICD-10-CM | POA: Diagnosis not present

## 2022-12-01 DIAGNOSIS — Z86010 Personal history of colonic polyps: Secondary | ICD-10-CM

## 2022-12-01 DIAGNOSIS — D125 Benign neoplasm of sigmoid colon: Secondary | ICD-10-CM | POA: Diagnosis not present

## 2022-12-01 NOTE — Progress Notes (Signed)
Attempted to obtain medical history via telephone, unable to reach at this time. HIPAA compliant voicemail message left requesting return call to pre surgical testing department. 

## 2022-12-01 NOTE — Patient Instructions (Signed)
You have been scheduled for a colonoscopy. Please follow written instructions given to you at your visit today.  Please pick up your prep supplies at the pharmacy within the next 1-3 days. If you use inhalers (even only as needed), please bring them with you on the day of your procedure.   ___________________________________  If your blood pressure at your visit was 140/90 or greater, please contact your primary care physician to follow up on this.  _______________________________________________________  If you are age 72 or older, your body mass index should be between 23-30. Your Body mass index is 27.28 kg/m. If this is out of the aforementioned range listed, please consider follow up with your Primary Care Provider.  If you are age 68 or younger, your body mass index should be between 19-25. Your Body mass index is 27.28 kg/m. If this is out of the aformentioned range listed, please consider follow up with your Primary Care Provider.   ________________________________________________________  The [facility] providers would like to encourage you to use [facility] to communicate with providers for non-urgent requests or questions.  Due to long hold times on the telephone, sending your provider a message by [facility] may be a faster and more efficient way to get a response.  Please allow 48 business hours for a response.  Please remember that this is for non-urgent requests.  _______________________________________________________  Thank you for choosing me and [facility].  Dr. Meridee Score

## 2022-12-05 DIAGNOSIS — Z86010 Personal history of colonic polyps: Secondary | ICD-10-CM | POA: Insufficient documentation

## 2022-12-05 DIAGNOSIS — D125 Benign neoplasm of sigmoid colon: Secondary | ICD-10-CM | POA: Insufficient documentation

## 2022-12-05 DIAGNOSIS — K635 Polyp of colon: Secondary | ICD-10-CM | POA: Insufficient documentation

## 2022-12-05 NOTE — H&P (View-Only) (Signed)
 GASTROENTEROLOGY OUTPATIENT CLINIC VISIT   Primary Care Provider Webb, Carol, MD [address] [place] [address] [phone]  Referring Provider Dr. Schooler  Patient Profile: Melanie Phelps is a 72 y.o. female with a pmh significant for allergies, colon polyps (TA's and SSP's with AO & [place] polyps in situ), status post appendectomy, diverticulosis, hemorrhoids.  The patient presents to the [facility] for an evaluation and management of problem(s) noted below:  Problem List 1. Hx of adenomatous colonic polyps   2. Appendiceal orifice/cecal polyp   3. Adenomatous polyp of sigmoid colon     History of Present Illness This is the patient's first visit to the outpatient [facility].  She recently underwent a surveillance colonoscopy with [facility].  She was found to have multiple polyps including appendiceal orifice polyp that returned on sampling as SSP as well as a large sigmoid colon polyp that returned as a tubular adenoma.  She is referred for consideration of advanced endoscopic resection with hope of trying to not need surgery.  The patient is not having any abdominal pain or discomfort.  She has not had any changes in her bowel habits.  She is not having any new GI symptoms to discuss.  GI Review of Systems Positive as above Negative for dysphagia, odynophagia, melena, hematochezia  Review of Systems General: Denies fevers/chills/weight loss unintentionally Cardiovascular: Denies chest pain Pulmonary: Denies shortness of breath Gastroenterological: See HPI Genitourinary: Denies darkened urine Hematological: Denies easy bruising/bleeding Dermatological: Denies jaundice Psychological: Mood is stable   Medications Current Outpatient Medications  Medication Sig Dispense Refill   acetaminophen (TYLENOL) 500 MG tablet Take 500 mg by mouth as needed.     Ascorbic Acid (VITAMIN C) 500 MG CHEW Chew 1  tablet by mouth daily.     Calcium Carbonate-Vitamin D (OS-CAL 500 + D PO) Take 1 capsule by mouth daily.     fluticasone (FLONASE) 50 MCG/ACT nasal spray      ibuprofen (ADVIL) 200 MG tablet Take 200-600 mg by mouth as needed.     ipratropium (ATROVENT) 0.06 % nasal spray      levocetirizine (XYZAL) 5 MG tablet TK 1 T PO QD IN THE EVE     montelukast (SINGULAIR) 10 MG tablet Take 10 mg by mouth at bedtime.     Multiple Vitamin (MULTIVITAMIN) capsule Take by mouth.     No current facility-administered medications for this visit.    Allergies Allergies  Allergen Reactions   Ciprofloxacin Rash    Histories Past Medical History:  Diagnosis Date   Allergies    Colon polyps    Pneumonia    Past Surgical History:  Procedure Laterality Date   APPENDECTOMY     TONSILLECTOMY     VAGINAL HYSTERECTOMY     with appendectomy   Social History   Socioeconomic History   Marital status: Married    Spouse name: Not on file   Number of children: 4   Years of education: Not on file   Highest education level: Not on file  Occupational History   Occupation: Director of [employer]  Tobacco Use   Smoking status: Never   Smokeless tobacco: Never  Vaping Use   Vaping Use: Never used  Substance and Sexual Activity   Alcohol use: Yes    Comment: 1 per day   Drug use: No   Sexual activity: Not on file  Other Topics Concern   Not on   file  Social History Narrative   Not on file   Social Determinants of Health   Financial Resource Strain: Not on file  Food Insecurity: Not on file  Transportation Needs: Not on file  Physical Activity: Not on file  Stress: Not on file  Social Connections: Not on file  Intimate Partner Violence: Not on file   Family History  Problem Relation Age of Onset   Alzheimer's disease Mother    Hypertension Mother    Mental illness Mother    Depression Mother    Diabetes Father    Heart failure Father     Hypertension Father    Alzheimer's disease Sister    Colon cancer Paternal Uncle    Polycystic ovary syndrome Daughter    I have reviewed her medical, social, and family history in detail and updated the electronic medical record as necessary.    PHYSICAL EXAMINATION  BP 132/70 (BP Location: Left Arm, Patient Position: Sitting, Cuff Size: Normal)   Pulse 64   Ht 5' 0.5" (1.537 m) Comment: height measured without shoes  Wt 142 lb (64.4 kg)   BMI 27.28 kg/m  Wt Readings from Last 3 Encounters:  12/01/22 142 lb (64.4 kg)  08/13/17 129 lb (58.5 kg)  06/30/17 131 lb 12.8 oz (59.8 kg)  GEN: NAD, appears stated age, doesn't appear chronically ill PSYCH: Cooperative, without pressured speech EYE: Conjunctivae pink, sclerae anicteric ENT: MMM CV: Nontachycardic RESP: No audible wheezing GI: NABS, soft, NT/ND, without rebound or guarding MSK/EXT: No significant lower extremity edema SKIN: No jaundice NEURO:  Alert & Oriented x 3, no focal deficits   REVIEW OF DATA  I reviewed the following data at the time of this encounter:  GI Procedures and Studies  February 2024 colonoscopy 1, 10 mm polyp at the appendiceal orifice.  Biopsied. One 2 mm polyp in the cecum removed with cold biopsy forceps. A 30 mm polypoid lesion was found in the sigmoid colon.  Lesion was polypoid.  No bleeding was present.  Biopsied.  Tattooed. Diverticulosis in the entire colon. Internal hemorrhoids.  Pathology Appendix orifice polyp-SSA Cecum polypectomy-tubular adenoma Sigmoid colon biopsy-tubular adenoma  Laboratory Studies  Reviewed those in epic  Imaging Studies  No relevant studies to review   ASSESSMENT  Ms. Wimbush is a 72 y.o. female with a pmh significant for allergies, colon polyps (TA's and SSP's with AO & [place] polyps in situ), status post appendectomy, diverticulosis, hemorrhoids. The patient is seen today for evaluation and management of:  1. Hx of adenomatous colonic polyps   2.  Appendiceal orifice/cecal polyp   3. Adenomatous polyp of sigmoid colon    The patient is hemodynamically and clinically stable at this time.  Based upon the description and endoscopic pictures I do feel that it is reasonable to pursue an Advanced Polypectomy attempt of the polyps.  We discussed some of the techniques of advanced polypectomy which include Endoscopic Mucosal Resection, OVESCO Full-Thickness Resection, Endorotor Morcellation, and Tissue Ablation via Fulguration.  We also reviewed images of typical techniques as noted above.  The risks and benefits of endoscopic evaluation were discussed with the patient; these include but are not limited to the risk of perforation, infection, bleeding, missed lesions, lack of diagnosis, severe illness requiring hospitalization, as well as anesthesia and sedation related illnesses.  During attempts at advanced resection, the risks of bleeding and perforation/leak are increased as opposed to diagnostic and screening procedures, and that was discussed with the patient as well.   In   addition, I explained that with the possible need for piecemeal resection, subsequent short-interval endoscopic evaluation for follow up and potential retreatment of the lesion/area may be necessary.  I did offer, a referral to surgery in order for patient to have opportunity to discuss surgical management/intervention prior to finalizing decision for attempt at endoscopic removal, however, the patient deferred on this.  If, after attempt at removal of the polyp/lesion, it is found that the patient has a complication or that an invasive lesion or malignant lesion is found, or that the polyp/lesion continues to recur, the patient is aware and understands that surgery may still be indicated/required.  All patient questions were answered, to the best of my ability, and the patient agrees to the aforementioned plan of action with follow-up as indicated.   PLAN  Proceed with scheduled  colonoscopy with EMR Will obtain recent blood work done through PCP office in the last few weeks Follow-up to be dictated based on results of colonoscopy   No orders of the defined types were placed in this encounter.   New Prescriptions   No medications on file   Modified Medications   No medications on file    Planned Follow Up No follow-ups on file.   Total Time in Face-to-Face and in Coordination of Care for patient including independent/personal interpretation/review of prior testing, medical history, examination, medication adjustment, communicating results with the patient directly, and documentation within the EHR is 45 minutes.   Masashi Snowdon Mansouraty, MD [facility] Advanced Endoscopy Office # [id number]  

## 2022-12-05 NOTE — Progress Notes (Signed)
GASTROENTEROLOGY OUTPATIENT CLINIC VISIT   Primary Care Provider Shirlean Mylar, MD [address] [place] [address] [phone]  Referring Provider Dr. Bosie Clos  Patient Profile: Melanie Phelps is a 72 y.o. female with a pmh significant for allergies, colon polyps (TA's and SSP's with AO & [place] polyps in situ), status post appendectomy, diverticulosis, hemorrhoids.  The patient presents to the [facility] for an evaluation and management of problem(s) noted below:  Problem List 1. Hx of adenomatous colonic polyps   2. Appendiceal orifice/cecal polyp   3. Adenomatous polyp of sigmoid colon     History of Present Illness This is the patient's first visit to the outpatient [facility].  She recently underwent a surveillance colonoscopy with [facility] gastroenterology.  She was found to have multiple polyps including appendiceal orifice polyp that returned on sampling as SSP as well as a large sigmoid colon polyp that returned as a tubular adenoma.  She is referred for consideration of advanced endoscopic resection with hope of trying to not need surgery.  The patient is not having any abdominal pain or discomfort.  She has not had any changes in her bowel habits.  She is not having any new GI symptoms to discuss.  GI Review of Systems Positive as above Negative for dysphagia, odynophagia, melena, hematochezia  Review of Systems General: Denies fevers/chills/weight loss unintentionally Cardiovascular: Denies chest pain Pulmonary: Denies shortness of breath Gastroenterological: See HPI Genitourinary: Denies darkened urine Hematological: Denies easy bruising/bleeding Dermatological: Denies jaundice Psychological: Mood is stable   Medications Current Outpatient Medications  Medication Sig Dispense Refill   acetaminophen (TYLENOL) 500 MG tablet Take 500 mg by mouth as needed.     Ascorbic Acid (VITAMIN C) 500 MG CHEW Chew 1  tablet by mouth daily.     Calcium Carbonate-Vitamin D (OS-CAL 500 + D PO) Take 1 capsule by mouth daily.     fluticasone (FLONASE) 50 MCG/ACT nasal spray      ibuprofen (ADVIL) 200 MG tablet Take 200-600 mg by mouth as needed.     ipratropium (ATROVENT) 0.06 % nasal spray      levocetirizine (XYZAL) 5 MG tablet TK 1 T PO QD IN THE EVE     montelukast (SINGULAIR) 10 MG tablet Take 10 mg by mouth at bedtime.     Multiple Vitamin (MULTIVITAMIN) capsule Take by mouth.     No current facility-administered medications for this visit.    Allergies Allergies  Allergen Reactions   Ciprofloxacin Rash    Histories Past Medical History:  Diagnosis Date   Allergies    Colon polyps    Pneumonia    Past Surgical History:  Procedure Laterality Date   APPENDECTOMY     TONSILLECTOMY     VAGINAL HYSTERECTOMY     with appendectomy   Social History   Socioeconomic History   Marital status: Married    Spouse name: Not on file   Number of children: 4   Years of education: Not on file   Highest education level: Not on file  Occupational History   Occupation: Interior and spatial designer of [employer]  Tobacco Use   Smoking status: Never   Smokeless tobacco: Never  Vaping Use   Vaping Use: Never used  Substance and Sexual Activity   Alcohol use: Yes    Comment: 1 per day   Drug use: No   Sexual activity: Not on file  Other Topics Concern   Not on  file  Social History Narrative   Not on file   Social Determinants of Health   Financial Resource Strain: Not on file  Food Insecurity: Not on file  Transportation Needs: Not on file  Physical Activity: Not on file  Stress: Not on file  Social Connections: Not on file  Intimate Partner Violence: Not on file   Family History  Problem Relation Age of Onset   Alzheimer's disease Mother    Hypertension Mother    Mental illness Mother    Depression Mother    Diabetes Father    Heart failure Father     Hypertension Father    Alzheimer's disease Sister    Colon cancer Paternal Uncle    Polycystic ovary syndrome Daughter    I have reviewed her medical, social, and family history in detail and updated the electronic medical record as necessary.    PHYSICAL EXAMINATION  BP 132/70 (BP Location: Left Arm, Patient Position: Sitting, Cuff Size: Normal)   Pulse 64   Ht 5' 0.5" (1.537 m) Comment: height measured without shoes  Wt 142 lb (64.4 kg)   BMI 27.28 kg/m  Wt Readings from Last 3 Encounters:  12/01/22 142 lb (64.4 kg)  08/13/17 129 lb (58.5 kg)  06/30/17 131 lb 12.8 oz (59.8 kg)  GEN: NAD, appears stated age, doesn't appear chronically ill PSYCH: Cooperative, without pressured speech EYE: Conjunctivae pink, sclerae anicteric ENT: MMM CV: Nontachycardic RESP: No audible wheezing GI: NABS, soft, NT/ND, without rebound or guarding MSK/EXT: No significant lower extremity edema SKIN: No jaundice NEURO:  Alert & Oriented x 3, no focal deficits   REVIEW OF DATA  I reviewed the following data at the time of this encounter:  GI Procedures and Studies  February 2024 colonoscopy 1, 10 mm polyp at the appendiceal orifice.  Biopsied. One 2 mm polyp in the cecum removed with cold biopsy forceps. A 30 mm polypoid lesion was found in the sigmoid colon.  Lesion was polypoid.  No bleeding was present.  Biopsied.  Tattooed. Diverticulosis in the entire colon. Internal hemorrhoids.  Pathology Appendix orifice polyp-SSA Cecum polypectomy-tubular adenoma Sigmoid colon biopsy-tubular adenoma  Laboratory Studies  Reviewed those in epic  Imaging Studies  No relevant studies to review   ASSESSMENT  Ms. Hanif is a 72 y.o. female with a pmh significant for allergies, colon polyps (TA's and SSP's with AO & [place] polyps in situ), status post appendectomy, diverticulosis, hemorrhoids. The patient is seen today for evaluation and management of:  1. Hx of adenomatous colonic polyps   2.  Appendiceal orifice/cecal polyp   3. Adenomatous polyp of sigmoid colon    The patient is hemodynamically and clinically stable at this time.  Based upon the description and endoscopic pictures I do feel that it is reasonable to pursue an Advanced Polypectomy attempt of the polyps.  We discussed some of the techniques of advanced polypectomy which include Endoscopic Mucosal Resection, OVESCO Full-Thickness Resection, Endorotor Morcellation, and Tissue Ablation via Fulguration.  We also reviewed images of typical techniques as noted above.  The risks and benefits of endoscopic evaluation were discussed with the patient; these include but are not limited to the risk of perforation, infection, bleeding, missed lesions, lack of diagnosis, severe illness requiring hospitalization, as well as anesthesia and sedation related illnesses.  During attempts at advanced resection, the risks of bleeding and perforation/leak are increased as opposed to diagnostic and screening procedures, and that was discussed with the patient as well.   In  addition, I explained that with the possible need for piecemeal resection, subsequent short-interval endoscopic evaluation for follow up and potential retreatment of the lesion/area may be necessary.  I did offer, a referral to surgery in order for patient to have opportunity to discuss surgical management/intervention prior to finalizing decision for attempt at endoscopic removal, however, the patient deferred on this.  If, after attempt at removal of the polyp/lesion, it is found that the patient has a complication or that an invasive lesion or malignant lesion is found, or that the polyp/lesion continues to recur, the patient is aware and understands that surgery may still be indicated/required.  All patient questions were answered, to the best of my ability, and the patient agrees to the aforementioned plan of action with follow-up as indicated.   PLAN  Proceed with scheduled  colonoscopy with EMR Will obtain recent blood work done through PCP office in the last few weeks Follow-up to be dictated based on results of colonoscopy   No orders of the defined types were placed in this encounter.   New Prescriptions   No medications on file   Modified Medications   No medications on file    Planned Follow Up No follow-ups on file.   Total Time in Face-to-Face and in Coordination of Care for patient including independent/personal interpretation/review of prior testing, medical history, examination, medication adjustment, communicating results with the patient directly, and documentation within the EHR is 45 minutes.   Corliss Parish, MD [facility] Advanced Endoscopy Office # [phone]

## 2022-12-07 ENCOUNTER — Encounter: Payer: Self-pay | Admitting: Gastroenterology

## 2022-12-07 ENCOUNTER — Other Ambulatory Visit (INDEPENDENT_AMBULATORY_CARE_PROVIDER_SITE_OTHER): Payer: BC Managed Care – PPO

## 2022-12-07 ENCOUNTER — Other Ambulatory Visit: Payer: Self-pay

## 2022-12-07 DIAGNOSIS — D125 Benign neoplasm of sigmoid colon: Secondary | ICD-10-CM | POA: Diagnosis not present

## 2022-12-07 DIAGNOSIS — Z86010 Personal history of colonic polyps: Secondary | ICD-10-CM

## 2022-12-07 LAB — BASIC METABOLIC PANEL
BUN: 17 mg/dL (ref 6–23)
CO2: 28 mEq/L (ref 19–32)
Calcium: 9.5 mg/dL (ref 8.4–10.5)
Chloride: 102 mEq/L (ref 96–112)
Creatinine, Ser: 0.84 mg/dL (ref 0.40–1.20)
GFR: 69.5 mL/min (ref >60.00)
Glucose, Bld: 136 mg/dL — ABNORMAL HIGH (ref 70–99)
Potassium: 4.1 mEq/L (ref 3.5–5.1)
Sodium: 141 mEq/L (ref 135–145)

## 2022-12-07 LAB — PROTIME-INR
INR: 1 ratio (ref 0.8–1.0)
Prothrombin Time: 10.2 s (ref 9.6–13.1)

## 2022-12-07 NOTE — Progress Notes (Signed)
Lab order entered in epic for BMET and INR. Message left for patient asking to come in for labs.

## 2022-12-07 NOTE — Progress Notes (Signed)
Recent blood work form 4/24  WBC 4.3 Hgb/Hct 14.1/41.5 PLT 298   This will be scanned.  GM

## 2022-12-07 NOTE — H&P (View-Only) (Signed)
Recent blood work form 4/24  WBC 4.3 Hgb/Hct 14.1/41.5 PLT 298   This will be scanned.  GM 

## 2022-12-08 NOTE — Anesthesia Preprocedure Evaluation (Signed)
Anesthesia Evaluation  Patient identified by MRN, date of birth, ID band Patient awake    Reviewed: Allergy & Precautions, NPO status , Patient's Chart, lab work & pertinent test results  History of Anesthesia Complications Negative for: history of anesthetic complications  Airway Mallampati: II  TM Distance: >3 FB Neck ROM: Full    Dental  (+) Dental Advisory Given, Teeth Intact   Pulmonary neg pulmonary ROS   Pulmonary exam normal        Cardiovascular negative cardio ROS Normal cardiovascular exam     Neuro/Psych negative neurological ROS  negative psych ROS   GI/Hepatic negative GI ROS, Neg liver ROS,,,  Endo/Other  negative endocrine ROS    Renal/GU negative Renal ROS     Musculoskeletal negative musculoskeletal ROS (+)    Abdominal   Peds  Hematology negative hematology ROS (+)   Anesthesia Other Findings   Reproductive/Obstetrics                             Anesthesia Physical Anesthesia Plan  ASA: 1  Anesthesia Plan: MAC   Post-op Pain Management: Minimal or no pain anticipated   Induction: Intravenous  PONV Risk Score and Plan: 2 and Propofol infusion and Treatment may vary due to age or medical condition  Airway Management Planned: Nasal Cannula and Natural Airway  Additional Equipment: None  Intra-op Plan:   Post-operative Plan:   Informed Consent: I have reviewed the patients History and Physical, chart, labs and discussed the procedure including the risks, benefits and alternatives for the proposed anesthesia with the patient or authorized representative who has indicated his/her understanding and acceptance.     Dental advisory given  Plan Discussed with: CRNA and Anesthesiologist  Anesthesia Plan Comments:         Anesthesia Quick Evaluation

## 2022-12-09 ENCOUNTER — Encounter (HOSPITAL_COMMUNITY): Payer: Self-pay | Admitting: Gastroenterology

## 2022-12-09 ENCOUNTER — Encounter (HOSPITAL_COMMUNITY)
Admission: RE | Disposition: A | Discharge status: Home / Self Care | Payer: Self-pay | Source: Home / Self Care | Attending: Gastroenterology

## 2022-12-09 ENCOUNTER — Ambulatory Visit (HOSPITAL_COMMUNITY): Payer: BC Managed Care – PPO | Admitting: Anesthesiology

## 2022-12-09 ENCOUNTER — Ambulatory Visit (HOSPITAL_COMMUNITY)
Admission: RE | Admit: 2022-12-09 | Discharge: 2022-12-09 | Disposition: A | Discharge status: Home / Self Care | Payer: BC Managed Care – PPO | Attending: Gastroenterology | Admitting: Gastroenterology

## 2022-12-09 ENCOUNTER — Other Ambulatory Visit: Payer: Self-pay

## 2022-12-09 DIAGNOSIS — K644 Residual hemorrhoidal skin tags: Secondary | ICD-10-CM | POA: Diagnosis not present

## 2022-12-09 DIAGNOSIS — K5730 Diverticulosis of large intestine without perforation or abscess without bleeding: Secondary | ICD-10-CM | POA: Diagnosis not present

## 2022-12-09 DIAGNOSIS — Q438 Other specified congenital malformations of intestine: Secondary | ICD-10-CM | POA: Insufficient documentation

## 2022-12-09 DIAGNOSIS — D120 Benign neoplasm of cecum: Secondary | ICD-10-CM | POA: Diagnosis not present

## 2022-12-09 DIAGNOSIS — Z86010 Personal history of colonic polyps: Secondary | ICD-10-CM

## 2022-12-09 DIAGNOSIS — D122 Benign neoplasm of ascending colon: Secondary | ICD-10-CM | POA: Insufficient documentation

## 2022-12-09 DIAGNOSIS — K635 Polyp of colon: Secondary | ICD-10-CM

## 2022-12-09 DIAGNOSIS — K5790 Diverticulosis of intestine, part unspecified, without perforation or abscess without bleeding: Secondary | ICD-10-CM | POA: Diagnosis not present

## 2022-12-09 DIAGNOSIS — D125 Benign neoplasm of sigmoid colon: Secondary | ICD-10-CM | POA: Insufficient documentation

## 2022-12-09 DIAGNOSIS — K641 Second degree hemorrhoids: Secondary | ICD-10-CM | POA: Diagnosis not present

## 2022-12-09 DIAGNOSIS — D121 Benign neoplasm of appendix: Secondary | ICD-10-CM | POA: Insufficient documentation

## 2022-12-09 HISTORY — PX: ENDOSCOPIC MUCOSAL RESECTION: SHX6839

## 2022-12-09 HISTORY — PX: COLONOSCOPY WITH PROPOFOL: SHX5780

## 2022-12-09 HISTORY — PX: SUBMUCOSAL LIFTING INJECTION: SHX6855

## 2022-12-09 HISTORY — PX: POLYPECTOMY: SHX5525

## 2022-12-09 HISTORY — PX: HEMOSTASIS CLIP PLACEMENT: SHX6857

## 2022-12-09 SURGERY — COLONOSCOPY WITH PROPOFOL
Anesthesia: Monitor Anesthesia Care

## 2022-12-09 MED ORDER — DOCUSATE SODIUM 100 MG PO CAPS: 100.0000 mg | ORAL_CAPSULE | Freq: Every day | ORAL | 2 refills | Status: AC

## 2022-12-09 MED ORDER — SODIUM CHLORIDE 0.9 % IV SOLN: INTRAVENOUS | Status: DC

## 2022-12-09 MED ORDER — SODIUM CHLORIDE 0.9 % IV SOLN
1.5000 g | Freq: Once | INTRAVENOUS | Status: DC
  Filled 2022-12-09: qty 4

## 2022-12-09 MED ORDER — PROPOFOL 500 MG/50ML IV EMUL
INTRAVENOUS | Status: DC | PRN
  Administered 2022-12-09: 125 ug/kg/min via INTRAVENOUS

## 2022-12-09 MED ORDER — PROPOFOL 10 MG/ML IV BOLUS
INTRAVENOUS | Status: DC | PRN
  Administered 2022-12-09: 60 mg via INTRAVENOUS
  Administered 2022-12-09 (×2): 25 mg via INTRAVENOUS
  Administered 2022-12-09: 30 mg via INTRAVENOUS
  Administered 2022-12-09: 40 mg via INTRAVENOUS

## 2022-12-09 MED ORDER — SODIUM CHLORIDE 0.9 % IV SOLN
INTRAVENOUS | Status: DC | PRN
  Administered 2022-12-09: 3 g via INTRAVENOUS

## 2022-12-09 MED ORDER — AMOXICILLIN-POT CLAVULANATE 875-125 MG PO TABS: 1.0000 | ORAL_TABLET | Freq: Two times a day (BID) | ORAL | 0 refills | Status: AC

## 2022-12-09 MED ORDER — LACTATED RINGERS IV SOLN
INTRAVENOUS | Status: DC
  Administered 2022-12-09: 1000 mL via INTRAVENOUS

## 2022-12-09 SURGICAL SUPPLY — 22 items

## 2022-12-09 NOTE — Op Note (Signed)
[facility] Patient Name: Melanie Phelps Procedure Date: 12/09/2022 MRN: [id number] Attending MD: Corliss Parish , MD, [id number] Date of Birth: October 19, 1950 CSN: [id number] Age: 72 Admit Type: Outpatient Procedure:                Colonoscopy Indications:              Excision of colonic polyps (appendiceal orifice SSP                            and sigmoid colon TA) Providers:                Corliss Parish, MD, Adolph Pollack, RN,                            Leanne Lovely, Technician, Stephanie [place],                            CRNA Referring MD:             Shirley Friar, MD, Garth Bigness Medicines:                Monitored Anesthesia Care, Unasyn 1.5 g IV Complications:            No immediate complications. Estimated Blood Loss:     Estimated blood loss was minimal. Procedure:                Pre-Anesthesia Assessment:                           - Prior to the procedure, a History and Physical                            was performed, and patient medications and                            allergies were reviewed. The patient's tolerance of                            previous anesthesia was also reviewed. The risks                            and benefits of the procedure and the sedation                            options and risks were discussed with the patient.                            All questions were answered, and informed consent                            was obtained. Prior Anticoagulants: The patient has                            taken no anticoagulant or antiplatelet agents  except for NSAID medication. ASA Grade Assessment:                            II - A patient with mild systemic disease. After                            reviewing the risks and benefits, the patient was                            deemed in satisfactory condition to undergo the                            procedure.                            After obtaining informed consent, the colonoscope                            was passed under direct vision. Throughout the                            procedure, the patient's blood pressure, pulse, and                            oxygen saturations were monitored continuously. The                            CF-HQ190L ([id number]) Olympus colonoscope was                            introduced through the anus and advanced to the the                            cecum, identified by appendiceal orifice and                            ileocecal valve. The colonoscopy was technically                            difficult and complex due to restricted mobility of                            the colon, significant looping and a tortuous                            colon. Successful completion of the procedure was                            aided by changing the patient's position,                            withdrawing and reinserting the scope,  straightening and shortening the scope to obtain                            bowel loop reduction and using scope torsion. The                            patient tolerated the procedure. The quality of the                            bowel preparation was adequate. The ileocecal                            valve, appendiceal orifice, and rectum were                            photographed. Scope In: 7:52:55 AM Scope Out: 8:43:48 AM Scope Withdrawal Time: 0 hours 42 minutes 29 seconds  Total Procedure Duration: 0 hours 50 minutes 53 seconds  Findings:      Skin tags were found on perianal exam.      The digital rectal exam findings include hemorrhoids. Pertinent       negatives include no palpable rectal lesions.      A large amount of liquid and semi-solid stool (stool balls) was found in       the entire colon, interfering with visualization. Lavage of the area was       performed using copious amounts, resulting in clearance  with adequate       visualization.      The left colon was significantly tortuous and as a result of the       diverticular presents was more restricted in regards to distensibility.      A 13 mm polyp was found in the appendiceal orifice region (though this       patient has previously had appendectomy). The polyp was semi-sessile.       Preparations were made for mucosal resection. Demarcation of the lesion       was performed with high-definition white light and narrow band imaging       to clearly identify the boundaries of the lesion. EverLift was injected       to raise the lesion. Snare mucosal resection was performed. Resection       and retrieval were complete. Resected tissue margins were examined and       clear of polyp tissue.      Three sessile polyps were found in the ascending colon (2) and cecum       (1). The polyps were 2 to 4 mm in size. These polyps were removed with a       cold snare. Resection and retrieval were complete.      Many medium-mouthed and small-mouthed diverticula were found in the       entire colon but the most extensive amount of disease was on the left       colon which led to restriction of distensibility.      A 25 mm polyp was found in the sigmoid colon. The polyp was sessile.       Tattoo was present beneath and proximally with some distal extent as       well. This polyp laterally spread into the very close  approximation of a       diverticulum. Preparations were made for mucosal resection. Demarcation       of the lesion was performed with high-definition white light and narrow       band imaging to clearly identify the boundaries of the lesion. EverLift       was injected to raise the lesion and to try to lift the lesion away from       the diverticulum that it was adjacent to, and this was felt to be mostly       successful. Piecemeal mucosal resection using a snare was performed (I       chose cold snare because of the location being next  to/nearly within a       diverticulum). Resection and retrieval were complete. Resected tissue       margins were examined and clear of polyp tissue. To prevent bleeding       after mucosal resection, two hemostatic clips were successfully placed       (MR conditional). Clip manufacturer: [employer]. There was no       bleeding at the end of the procedure.      Non-bleeding non-thrombosed external and internal hemorrhoids were found       during retroflexion, during perianal exam and during digital exam. The       hemorrhoids were Grade II (internal hemorrhoids that prolapse but reduce       spontaneously). Impression:               - Perianal skin tags found on perianal exam.                            Hemorrhoids found on digital rectal exam.                           - Stool in the entire examined colon.                           - Tortuous colon. There was restricted mobility of                            the colon as well.                           - One 13 mm polyp at the appendiceal orifice,                            removed with mucosal resection. Resected and                            retrieved.                           - Three 2 to 4 mm polyps in the ascending colon and                            in the cecum, removed with a cold snare. Resected  and retrieved.                           - Diverticulosis in the entire examined colon (low                            burden largest in the left colon).                           - One 25 mm polyp in the sigmoid colon (tattoo                            underneath and proximal/distal) with lateral spread                            into a diverticulum edge; removed with piecemeal                            cold mucosal resection. Resected and retrieved.                            Clips (MR conditional) were placed. Clip                            manufacturer: [employer].                            - Non-bleeding non-thrombosed external and internal                            hemorrhoids. Moderate Sedation:      Not Applicable - Patient had care per Anesthesia. Recommendation:           - The patient will be observed post-procedure,                            until all discharge criteria are met.                           - Discharge patient to home.                           - Patient has a contact number available for                            emergencies. The signs and symptoms of potential                            delayed complications were discussed with the                            patient. Return to normal activities tomorrow.                            Written discharge instructions were provided to the  patient.                           - High fiber diet.                           - Stool softeners daily for 2 weeks.                           - If patient is dealing with any constipation,                            recommend MiraLAX daily as needed.                           - Minimize NSAID use as able for at least 1 week.                           - Augmentin 875 mg twice daily for 5 days to                            decrease risk of post interventional diverticulitis.                           - Continue present medications otherwise.                           - Await pathology results.                           - Repeat colonoscopy in likely 6 to 9 months for                            surveillance. Recommend daily MiraLAX the week                            before this procedure or consider a 2-day                            preparation.                           - The findings and recommendations were discussed                            with the patient.                           - The findings and recommendations were discussed                            with the patient's family. Procedure Code(s):        --- Professional ---                            [phone], Colonoscopy, flexible; with endoscopic  mucosal resection                           45385, 59, Colonoscopy, flexible; with removal of                            tumor(s), polyp(s), or other lesion(s) by snare                            technique Diagnosis Code(s):        --- Professional ---                           K64.1, Second degree hemorrhoids                           D12.1, Benign neoplasm of appendix                           D12.5, Benign neoplasm of sigmoid colon                           D12.2, Benign neoplasm of ascending colon                           D12.0, Benign neoplasm of cecum                           K64.4, Residual hemorrhoidal skin tags                           K63.5, Polyp of colon                           K57.30, Diverticulosis of large intestine without                            perforation or abscess without bleeding                           Q43.8, Other specified congenital malformations of                            intestine CPT copyright 2022 American Medical Association. All rights reserved. The codes documented in this report are preliminary and upon coder review may  be revised to meet current compliance requirements. Corliss Parish, MD 12/09/2022 9:05:36 AM Number of Addenda: 0

## 2022-12-09 NOTE — Anesthesia Postprocedure Evaluation (Signed)
Anesthesia Post Note  Patient: Melanie Phelps  Procedure(s) Performed: COLONOSCOPY WITH PROPOFOL ENDOSCOPIC MUCOSAL RESECTION SUBMUCOSAL LIFTING INJECTION POLYPECTOMY HEMOSTASIS CLIP PLACEMENT     Patient location during evaluation: PACU Anesthesia Type: MAC Level of consciousness: awake and alert Pain management: pain level controlled Vital Signs Assessment: post-procedure vital signs reviewed and stable Respiratory status: spontaneous breathing, nonlabored ventilation and respiratory function stable Cardiovascular status: stable and blood pressure returned to baseline Anesthetic complications: no   No notable events documented.  Last Vitals:  Vitals:   12/09/22 0900 12/09/22 0910  BP: (!) 172/82 (!) 166/79  Pulse: (!) 55 (!) 54  Resp: 18 (!) 22  Temp:    SpO2: 94% 97%    Last Pain:  Vitals:   12/09/22 0910  TempSrc:   PainSc: 0-No pain                 Beryle Lathe

## 2022-12-09 NOTE — Interval H&P Note (Signed)
History and Physical Interval Note:  12/09/2022 5:58 AM  Melanie Phelps  has presented today for surgery, with the diagnosis of Hx of Colon polyps.  The various methods of treatment have been discussed with the patient and family. After consideration of risks, benefits and other options for treatment, the patient has consented to  Procedure(s): COLONOSCOPY WITH PROPOFOL (N/A) ENDOSCOPIC MUCOSAL RESECTION (N/A) as a surgical intervention.  The patient's history has been reviewed, patient examined, no change in status, stable for surgery.  I have reviewed the patient's chart and labs.  Questions were answered to the patient's satisfaction.     [employer]

## 2022-12-09 NOTE — Interval H&P Note (Signed)
History and Physical Interval Note:  12/09/2022 5:57 AM  Melanie Phelps  has presented today for surgery, with the diagnosis of Hx of Colon polyps.  The various methods of treatment have been discussed with the patient and family. After consideration of risks, benefits and other options for treatment, the patient has consented to  Procedure(s): COLONOSCOPY WITH PROPOFOL (N/A) ENDOSCOPIC MUCOSAL RESECTION (N/A) as a surgical intervention.  The patient's history has been reviewed, patient examined, no change in status, stable for surgery.  I have reviewed the patient's chart and labs.  Questions were answered to the patient's satisfaction.     [employer]

## 2022-12-09 NOTE — Discharge Instructions (Signed)
YOU HAD AN ENDOSCOPIC PROCEDURE TODAY: Refer to the procedure report and other information in the discharge instructions given to you for any specific questions about what was found during the examination. If this information does not answer your questions, please call [place] office at [phone] to clarify.   YOU SHOULD EXPECT: Some feelings of bloating in the abdomen. Passage of more gas than usual. Walking can help get rid of the air that was put into your GI tract during the procedure and reduce the bloating. If you had a lower endoscopy (such as a colonoscopy or flexible sigmoidoscopy) you may notice spotting of blood in your stool or on the toilet paper. Some abdominal soreness may be present for a day or two, also.  DIET: Your first meal following the procedure should be a light meal and then it is ok to progress to your normal diet. A half-sandwich or bowl of soup is an example of a good first meal. Heavy or fried foods are harder to digest and may make you feel nauseous or bloated. Drink plenty of fluids but you should avoid alcoholic beverages for 24 hours. If you had a esophageal dilation, please see attached instructions for diet.    ACTIVITY: Your care partner should take you home directly after the procedure. You should plan to take it easy, moving slowly for the rest of the day. You can resume normal activity the day after the procedure however YOU SHOULD NOT DRIVE, use power tools, machinery or perform tasks that involve climbing or major physical exertion for 24 hours (because of the sedation medicines used during the test).   SYMPTOMS TO REPORT IMMEDIATELY: A gastroenterologist can be reached at any hour. Please call [phone]  for any of the following symptoms:  Following lower endoscopy (colonoscopy, flexible sigmoidoscopy) Excessive amounts of blood in the stool  Significant tenderness, worsening of abdominal pains  Swelling of the abdomen that is new, acute  Fever of 100 or  higher  Following upper endoscopy (EGD, EUS, ERCP, esophageal dilation) Vomiting of blood or coffee ground material  New, significant abdominal pain  New, significant chest pain or pain under the shoulder blades  Painful or persistently difficult swallowing  New shortness of breath  Black, tarry-looking or red, bloody stools  FOLLOW UP:  If any biopsies were taken you will be contacted by phone or by letter within the next 1-3 weeks. Call [phone]  if you have not heard about the biopsies in 3 weeks.  Please also call with any specific questions about appointments or follow up tests.YOU HAD AN ENDOSCOPIC PROCEDURE TODAY: Refer to the procedure report and other information in the discharge instructions given to you for any specific questions about what was found during the examination. If this information does not answer your questions, please call [place] office at [phone] to clarify.   YOU SHOULD EXPECT: Some feelings of bloating in the abdomen. Passage of more gas than usual. Walking can help get rid of the air that was put into your GI tract during the procedure and reduce the bloating. If you had a lower endoscopy (such as a colonoscopy or flexible sigmoidoscopy) you may notice spotting of blood in your stool or on the toilet paper. Some abdominal soreness may be present for a day or two, also.  DIET: Your first meal following the procedure should be a light meal and then it is ok to progress to your normal diet. A half-sandwich or bowl of soup is an example of a   good first meal. Heavy or fried foods are harder to digest and may make you feel nauseous or bloated. Drink plenty of fluids but you should avoid alcoholic beverages for 24 hours. If you had a esophageal dilation, please see attached instructions for diet.    ACTIVITY: Your care partner should take you home directly after the procedure. You should plan to take it easy, moving slowly for the rest of the day. You can resume  normal activity the day after the procedure however YOU SHOULD NOT DRIVE, use power tools, machinery or perform tasks that involve climbing or major physical exertion for 24 hours (because of the sedation medicines used during the test).   SYMPTOMS TO REPORT IMMEDIATELY: A gastroenterologist can be reached at any hour. Please call [phone]  for any of the following symptoms:  Following lower endoscopy (colonoscopy, flexible sigmoidoscopy) Excessive amounts of blood in the stool  Significant tenderness, worsening of abdominal pains  Swelling of the abdomen that is new, acute  Fever of 100 or higher  Following upper endoscopy (EGD, EUS, ERCP, esophageal dilation) Vomiting of blood or coffee ground material  New, significant abdominal pain  New, significant chest pain or pain under the shoulder blades  Painful or persistently difficult swallowing  New shortness of breath  Black, tarry-looking or red, bloody stools  FOLLOW UP:  If any biopsies were taken you will be contacted by phone or by letter within the next 1-3 weeks. Call [phone]  if you have not heard about the biopsies in 3 weeks.  Please also call with any specific questions about appointments or follow up tests. 

## 2022-12-09 NOTE — Transfer of Care (Signed)
Immediate Anesthesia Transfer of Care Note  Patient: Melanie Phelps  Procedure(s) Performed: COLONOSCOPY WITH PROPOFOL ENDOSCOPIC MUCOSAL RESECTION SUBMUCOSAL LIFTING INJECTION POLYPECTOMY HEMOSTASIS CLIP PLACEMENT  Patient Location: PACU and Endoscopy Unit  Anesthesia Type:MAC  Level of Consciousness: awake, alert , and oriented  Airway & Oxygen Therapy: Patient Spontanous Breathing  Post-op Assessment: Report given to RN and Post -op Vital signs reviewed and stable  Post vital signs: Reviewed and stable  Last Vitals:  Vitals Value Taken Time  BP    Temp    Pulse    Resp    SpO2      Last Pain:  Vitals:   12/09/22 0635  TempSrc: Tympanic  PainSc: 0-No pain         Complications: No notable events documented.

## 2022-12-10 LAB — SURGICAL PATHOLOGY

## 2022-12-11 ENCOUNTER — Encounter: Payer: Self-pay | Admitting: Gastroenterology

## 2022-12-14 ENCOUNTER — Encounter (HOSPITAL_COMMUNITY): Payer: Self-pay | Admitting: Gastroenterology

## 2022-12-16 DIAGNOSIS — L814 Other melanin hyperpigmentation: Secondary | ICD-10-CM | POA: Diagnosis not present

## 2022-12-16 DIAGNOSIS — L538 Other specified erythematous conditions: Secondary | ICD-10-CM | POA: Diagnosis not present

## 2022-12-16 DIAGNOSIS — L821 Other seborrheic keratosis: Secondary | ICD-10-CM | POA: Diagnosis not present

## 2022-12-16 DIAGNOSIS — L820 Inflamed seborrheic keratosis: Secondary | ICD-10-CM | POA: Diagnosis not present

## 2022-12-16 DIAGNOSIS — D225 Melanocytic nevi of trunk: Secondary | ICD-10-CM | POA: Diagnosis not present

## 2023-01-11 DIAGNOSIS — J300 Vasomotor rhinitis: Secondary | ICD-10-CM | POA: Diagnosis not present

## 2023-01-28 DIAGNOSIS — H2513 Age-related nuclear cataract, bilateral: Secondary | ICD-10-CM | POA: Diagnosis not present

## 2023-01-28 DIAGNOSIS — H401421 Capsular glaucoma with pseudoexfoliation of lens, left eye, mild stage: Secondary | ICD-10-CM | POA: Diagnosis not present

## 2023-01-28 DIAGNOSIS — H5213 Myopia, bilateral: Secondary | ICD-10-CM | POA: Diagnosis not present

## 2023-04-23 DIAGNOSIS — R7303 Prediabetes: Secondary | ICD-10-CM | POA: Diagnosis not present

## 2023-07-11 DIAGNOSIS — R300 Dysuria: Secondary | ICD-10-CM | POA: Diagnosis not present

## 2023-07-11 DIAGNOSIS — N3000 Acute cystitis without hematuria: Secondary | ICD-10-CM | POA: Diagnosis not present

## 2023-07-28 ENCOUNTER — Other Ambulatory Visit: Payer: Self-pay | Admitting: Family Medicine

## 2023-07-28 DIAGNOSIS — Z1231 Encounter for screening mammogram for malignant neoplasm of breast: Secondary | ICD-10-CM

## 2023-08-17 ENCOUNTER — Ambulatory Visit
Admission: RE | Admit: 2023-08-17 | Discharge: 2023-08-17 | Disposition: A | Discharge status: Home / Self Care | Payer: BC Managed Care – PPO | Source: Ambulatory Visit | Attending: Family Medicine

## 2023-08-17 DIAGNOSIS — Z1231 Encounter for screening mammogram for malignant neoplasm of breast: Secondary | ICD-10-CM

## 2023-10-25 DIAGNOSIS — Z91038 Other insect allergy status: Secondary | ICD-10-CM | POA: Diagnosis not present

## 2023-11-08 DIAGNOSIS — R030 Elevated blood-pressure reading, without diagnosis of hypertension: Secondary | ICD-10-CM | POA: Diagnosis not present

## 2023-11-08 DIAGNOSIS — Z0000 Encounter for general adult medical examination without abnormal findings: Secondary | ICD-10-CM | POA: Diagnosis not present

## 2023-11-08 DIAGNOSIS — E785 Hyperlipidemia, unspecified: Secondary | ICD-10-CM | POA: Diagnosis not present

## 2023-11-08 DIAGNOSIS — Z79899 Other long term (current) drug therapy: Secondary | ICD-10-CM | POA: Diagnosis not present

## 2023-11-08 DIAGNOSIS — R7303 Prediabetes: Secondary | ICD-10-CM | POA: Diagnosis not present

## 2023-11-18 DIAGNOSIS — E785 Hyperlipidemia, unspecified: Secondary | ICD-10-CM | POA: Diagnosis not present

## 2023-11-18 DIAGNOSIS — R7303 Prediabetes: Secondary | ICD-10-CM | POA: Diagnosis not present

## 2023-11-18 DIAGNOSIS — Z79899 Other long term (current) drug therapy: Secondary | ICD-10-CM | POA: Diagnosis not present

## 2023-12-21 DIAGNOSIS — L814 Other melanin hyperpigmentation: Secondary | ICD-10-CM | POA: Diagnosis not present

## 2023-12-21 DIAGNOSIS — L918 Other hypertrophic disorders of the skin: Secondary | ICD-10-CM | POA: Diagnosis not present

## 2023-12-21 DIAGNOSIS — L821 Other seborrheic keratosis: Secondary | ICD-10-CM | POA: Diagnosis not present

## 2023-12-21 DIAGNOSIS — D225 Melanocytic nevi of trunk: Secondary | ICD-10-CM | POA: Diagnosis not present

## 2024-01-10 DIAGNOSIS — J300 Vasomotor rhinitis: Secondary | ICD-10-CM | POA: Diagnosis not present

## 2024-02-03 DIAGNOSIS — H401421 Capsular glaucoma with pseudoexfoliation of lens, left eye, mild stage: Secondary | ICD-10-CM | POA: Diagnosis not present

## 2024-02-03 DIAGNOSIS — H2513 Age-related nuclear cataract, bilateral: Secondary | ICD-10-CM | POA: Diagnosis not present

## 2024-02-03 DIAGNOSIS — H5212 Myopia, left eye: Secondary | ICD-10-CM | POA: Diagnosis not present

## 2024-02-15 ENCOUNTER — Telehealth: Payer: Self-pay | Admitting: Gastroenterology

## 2024-02-15 NOTE — Telephone Encounter (Signed)
 Inbound call from patient requesting a call to discuss scheduling recall colonoscopy at the hospital. Please advise, thank you

## 2024-02-18 ENCOUNTER — Other Ambulatory Visit: Payer: Self-pay

## 2024-02-18 DIAGNOSIS — Z860100 Personal history of colon polyps, unspecified: Secondary | ICD-10-CM

## 2024-02-18 MED ORDER — NA SULFATE-K SULFATE-MG SULF 17.5-3.13-1.6 GM/177ML PO SOLN: 1.0000 | Freq: Once | ORAL | 0 refills | Status: AC

## 2024-02-18 NOTE — Telephone Encounter (Signed)
 Colon has been set up for 04/27/24 at 4 pm with GM at [facility]

## 2024-02-21 NOTE — Telephone Encounter (Signed)
 Left message on machine to call back

## 2024-02-22 NOTE — Telephone Encounter (Signed)
 Colon scheduled, pt instructed and medications reviewed.  Patient instructions mailed to home.  Patient to call with any questions or concerns.   Appt moved to 10/13 per pt request

## 2024-04-24 ENCOUNTER — Encounter (HOSPITAL_COMMUNITY): Payer: Self-pay | Admitting: Gastroenterology

## 2024-04-24 ENCOUNTER — Telehealth: Payer: Self-pay | Admitting: Gastroenterology

## 2024-04-24 NOTE — Telephone Encounter (Addendum)
 Procedure:Colonoscopy Procedure date: 05/01/24 Procedure location: [facility] Arrival Time: 6:30 am Spoke with the patient Y/N:   No, I left a detailed message on [phone] on 04/24/24 @ 10:06 am for the patient to return call   Any prep concerns? ___  Has the patient obtained the prep from the pharmacy ? ___ Do you have a care partner and transportation: ___ Any additional concerns? ___

## 2024-04-30 NOTE — Anesthesia Preprocedure Evaluation (Signed)
 Anesthesia Evaluation  Patient identified by MRN, date of birth, ID band Patient awake    Reviewed: Allergy & Precautions, NPO status , Patient's Chart, lab work & pertinent test results  History of Anesthesia Complications Negative for: history of anesthetic complications  Airway Mallampati: III  TM Distance: >3 FB Neck ROM: Full    Dental no notable dental hx. (+) Dental Advisory Given, Teeth Intact   Pulmonary neg pulmonary ROS   Pulmonary exam normal breath sounds clear to auscultation       Cardiovascular negative cardio ROS Normal cardiovascular exam Rhythm:Regular Rate:Normal     Neuro/Psych negative neurological ROS  negative psych ROS   GI/Hepatic negative GI ROS, Neg liver ROS,,,  Endo/Other  negative endocrine ROS    Renal/GU negative Renal ROS     Musculoskeletal negative musculoskeletal ROS (+)    Abdominal   Peds  Hematology negative hematology ROS (+)   Anesthesia Other Findings   Reproductive/Obstetrics                              Anesthesia Physical Anesthesia Plan  ASA: 2  Anesthesia Plan: MAC   Post-op Pain Management: Minimal or no pain anticipated   Induction: Intravenous  PONV Risk Score and Plan: 2 and Propofol  infusion, Treatment may vary due to age or medical condition and TIVA  Airway Management Planned: Nasal Cannula and Natural Airway  Additional Equipment: None  Intra-op Plan:   Post-operative Plan:   Informed Consent: I have reviewed the patients History and Physical, chart, labs and discussed the procedure including the risks, benefits and alternatives for the proposed anesthesia with the patient or authorized representative who has indicated his/her understanding and acceptance.     Dental advisory given  Plan Discussed with: CRNA  Anesthesia Plan Comments:          Anesthesia Quick Evaluation

## 2024-05-01 ENCOUNTER — Encounter (HOSPITAL_COMMUNITY)
Admission: RE | Disposition: A | Discharge status: Home / Self Care | Payer: Self-pay | Source: Home / Self Care | Attending: Gastroenterology

## 2024-05-01 ENCOUNTER — Ambulatory Visit (HOSPITAL_COMMUNITY): Payer: Self-pay | Admitting: Certified Registered Nurse Anesthetist

## 2024-05-01 ENCOUNTER — Other Ambulatory Visit: Payer: Self-pay

## 2024-05-01 ENCOUNTER — Encounter (HOSPITAL_COMMUNITY): Payer: Self-pay | Admitting: Gastroenterology

## 2024-05-01 ENCOUNTER — Ambulatory Visit (HOSPITAL_COMMUNITY)
Admission: RE | Admit: 2024-05-01 | Discharge: 2024-05-01 | Disposition: A | Discharge status: Home / Self Care | Attending: Gastroenterology | Admitting: Gastroenterology

## 2024-05-01 DIAGNOSIS — D120 Benign neoplasm of cecum: Secondary | ICD-10-CM | POA: Insufficient documentation

## 2024-05-01 DIAGNOSIS — D126 Benign neoplasm of colon, unspecified: Secondary | ICD-10-CM | POA: Diagnosis not present

## 2024-05-01 DIAGNOSIS — K644 Residual hemorrhoidal skin tags: Secondary | ICD-10-CM | POA: Insufficient documentation

## 2024-05-01 DIAGNOSIS — Z860100 Personal history of colon polyps, unspecified: Secondary | ICD-10-CM

## 2024-05-01 DIAGNOSIS — L818 Other specified disorders of pigmentation: Secondary | ICD-10-CM

## 2024-05-01 DIAGNOSIS — D124 Benign neoplasm of descending colon: Secondary | ICD-10-CM | POA: Diagnosis not present

## 2024-05-01 DIAGNOSIS — K641 Second degree hemorrhoids: Secondary | ICD-10-CM | POA: Diagnosis not present

## 2024-05-01 DIAGNOSIS — Q438 Other specified congenital malformations of intestine: Secondary | ICD-10-CM | POA: Diagnosis not present

## 2024-05-01 DIAGNOSIS — Z1211 Encounter for screening for malignant neoplasm of colon: Secondary | ICD-10-CM

## 2024-05-01 DIAGNOSIS — Z860101 Personal history of adenomatous and serrated colon polyps: Secondary | ICD-10-CM

## 2024-05-01 DIAGNOSIS — K5730 Diverticulosis of large intestine without perforation or abscess without bleeding: Secondary | ICD-10-CM | POA: Diagnosis not present

## 2024-05-01 DIAGNOSIS — Z9889 Other specified postprocedural states: Secondary | ICD-10-CM

## 2024-05-01 DIAGNOSIS — Z09 Encounter for follow-up examination after completed treatment for conditions other than malignant neoplasm: Secondary | ICD-10-CM | POA: Diagnosis not present

## 2024-05-01 DIAGNOSIS — J90 Pleural effusion, not elsewhere classified: Secondary | ICD-10-CM | POA: Diagnosis not present

## 2024-05-01 DIAGNOSIS — Q439 Congenital malformation of intestine, unspecified: Secondary | ICD-10-CM

## 2024-05-01 SURGERY — COLONOSCOPY
Anesthesia: Monitor Anesthesia Care

## 2024-05-01 MED ORDER — PROPOFOL 10 MG/ML IV BOLUS
INTRAVENOUS | Status: DC | PRN
  Administered 2024-05-01: 100 mg via INTRAVENOUS
  Administered 2024-05-01: 50 mg via INTRAVENOUS

## 2024-05-01 MED ORDER — LIDOCAINE 2% (20 MG/ML) 5 ML SYRINGE
INTRAMUSCULAR | Status: DC | PRN
  Administered 2024-05-01: 40 mg via INTRAVENOUS

## 2024-05-01 MED ORDER — PROPOFOL 500 MG/50ML IV EMUL
INTRAVENOUS | Status: DC | PRN
  Administered 2024-05-01: 150 ug/kg/min via INTRAVENOUS

## 2024-05-01 MED ORDER — SODIUM CHLORIDE 0.9 % IV SOLN: INTRAVENOUS | Status: DC | PRN

## 2024-05-01 NOTE — Discharge Instructions (Signed)

## 2024-05-01 NOTE — Anesthesia Postprocedure Evaluation (Signed)
 Anesthesia Post Note  Patient: Melanie Phelps  Procedure(s) Performed: COLONOSCOPY     Patient location during evaluation: PACU Anesthesia Type: MAC Level of consciousness: awake and alert Pain management: pain level controlled Vital Signs Assessment: post-procedure vital signs reviewed and stable Respiratory status: spontaneous breathing Cardiovascular status: stable Anesthetic complications: no   No notable events documented.  Last Vitals:  Vitals:   05/01/24 0910 05/01/24 0917  BP: (!) 144/62 (!) 165/67  Pulse: (!) 56 (!) 51  Resp: 18 18  Temp:    SpO2: 96% 100%    Last Pain:  Vitals:   05/01/24 0917  TempSrc:   PainSc: 0-No pain                 [name]

## 2024-05-01 NOTE — H&P (Signed)
 GASTROENTEROLOGY PROCEDURE H&P NOTE   Primary Care Physician: Chrystal Lamarr RAMAN, MD  HPI: Melanie Phelps is a 73 y.o. female who presents for Colonoscopy for followup of AO SSP and [place] [facility] EMRs in 2024.  Past Medical History:  Diagnosis Date   Allergies    Colon polyps    Pneumonia    Past Surgical History:  Procedure Laterality Date   APPENDECTOMY     COLONOSCOPY WITH PROPOFOL  N/A 12/09/2022   Procedure: COLONOSCOPY WITH PROPOFOL ;  Surgeon: Mansouraty, Aloha Raddle., MD;  Location: [facility];  Service: Gastroenterology;  Laterality: N/A;   ENDOSCOPIC MUCOSAL RESECTION N/A 12/09/2022   Procedure: ENDOSCOPIC MUCOSAL RESECTION;  Surgeon: Wilhelmenia Aloha Raddle., MD;  Location: [facility];  Service: Gastroenterology;  Laterality: N/A;   HEMOSTASIS CLIP PLACEMENT  12/09/2022   Procedure: HEMOSTASIS CLIP PLACEMENT;  Surgeon: Wilhelmenia Aloha Raddle., MD;  Location: [facility];  Service: Gastroenterology;;   POLYPECTOMY  12/09/2022   Procedure: POLYPECTOMY;  Surgeon: Wilhelmenia Aloha Raddle., MD;  Location: [facility];  Service: Gastroenterology;;   ROBLEY MEYER INJECTION  12/09/2022   Procedure: SUBMUCOSAL LIFTING INJECTION;  Surgeon: Wilhelmenia Aloha Raddle., MD;  Location: [facility];  Service: Gastroenterology;;   TONSILLECTOMY     VAGINAL HYSTERECTOMY     with appendectomy   No current facility-administered medications for this encounter.   Facility-Administered Medications Ordered in Other Encounters  Medication Dose Route Frequency Provider Last Rate Last Admin   0.9 %  sodium chloride  infusion   Intravenous Continuous PRN Judythe Tanda Aran, CRNA   New Bag at 05/01/24 [phone]   No current facility-administered medications for this encounter.  Facility-Administered Medications Ordered in Other Encounters:    0.9 %  sodium chloride  infusion, , Intravenous, Continuous PRN, Judythe Tanda Aran, CRNA, New Bag at 05/01/24 [phone] Allergies  Allergen  Reactions   Ciprofloxacin Rash   Family History  Problem Relation Age of Onset   Alzheimer's disease Mother    Hypertension Mother    Mental illness Mother    Depression Mother    Diabetes Father    Heart failure Father    Hypertension Father    Alzheimer's disease Sister    Colon cancer Paternal Uncle    Polycystic ovary syndrome Daughter    Social History   Socioeconomic History   Marital status: Married    Spouse name: Not on file   Number of children: 4   Years of education: Not on file   Highest education level: Not on file  Occupational History   Occupation: Interior and spatial designer of [employer]  Tobacco Use   Smoking status: Never   Smokeless tobacco: Never  Vaping Use   Vaping status: Never Used  Substance and Sexual Activity   Alcohol use: Yes    Comment: 1 per day   Drug use: No   Sexual activity: Not on file  Other Topics Concern   Not on file  Social History Narrative   Not on file   Social Drivers of Health   Financial Resource Strain: Not on file  Food Insecurity: Not on file  Transportation Needs: Not on file  Physical Activity: Not on file  Stress: Not on file  Social Connections: Not on file  Intimate Partner Violence: Not on file    Physical Exam: Today's Vitals   05/01/24 0715  BP: (!) 158/72  Pulse: (!) 58  Resp: 15  Temp: 97.9 F (36.6 C)  TempSrc: Temporal  SpO2: 99%  Weight: 54.7 kg  Height: 5' 0.5 (1.537 m)  PainSc: 0-No pain   Body mass index is 23.15 kg/m. GEN: NAD EYE: Sclerae anicteric ENT: MMM CV: Non-tachycardic GI: Soft, NT/ND NEURO:  Alert & Oriented x 3  Lab Results: No results for input(s): WBC, HGB, HCT, PLT in the last 72 hours. BMET No results for input(s): NA, K, CL, CO2, GLUCOSE, BUN, CREATININE, CALCIUM in the last 72 hours. LFT No results for input(s): PROT, ALBUMIN, AST, ALT, ALKPHOS, BILITOT, BILIDIR, IBILI in the last 72  hours. PT/INR No results for input(s): LABPROT, INR in the last 72 hours.   Impression / Plan: This is a 73 y.o.female who presents for Colonoscopy for followup of AO SSP and [place] [facility] EMRs in 2024.  The risks and benefits of endoscopic evaluation/treatment were discussed with the patient and/or family; these include but are not limited to the risk of perforation, infection, bleeding, missed lesions, lack of diagnosis, severe illness requiring hospitalization, as well as anesthesia and sedation related illnesses.  The patient's history has been reviewed, patient examined, no change in status, and deemed stable for procedure.  The patient and/or family is agreeable to proceed.    Aloha Finner, MD [facility] Office # [phone]

## 2024-05-01 NOTE — Op Note (Addendum)
 [facility] Patient Name: Melanie Phelps Procedure Date: 05/01/2024 MRN: [id number] Attending MD: Aloha Finner , MD, [id number] Date of Birth: February 19, 1951 CSN: [id number] Age: 73 Admit Type: Outpatient Procedure:                Colonoscopy Indications:              High risk colon cancer surveillance: Personal                            history of adenoma (10 mm or greater in size), High                            risk colon cancer surveillance: Personal history of                            sessile serrated colon polyp (10 mm or greater in                            size) Providers:                Aloha Finner, MD, Robie Breed, RN,                            [facility] [place], Technician, Renay Darner Referring MD:             Jerrell KYM Sol, MD Medicines:                Monitored Anesthesia Care Complications:            No immediate complications. Estimated Blood Loss:     Estimated blood loss was minimal. Procedure:                Pre-Anesthesia Assessment:                           - Prior to the procedure, a History and Physical                            was performed, and patient medications and                            allergies were reviewed. The patient's tolerance of                            previous anesthesia was also reviewed. The risks                            and benefits of the procedure and the sedation                            options and risks were discussed with the patient.                            All questions were answered, and informed consent  was obtained. Prior Anticoagulants: The patient has                            taken no anticoagulant or antiplatelet agents. ASA                            Grade Assessment: III - A patient with severe                            systemic disease. After reviewing the risks and                            benefits, the patient was deemed in  satisfactory                            condition to undergo the procedure.                           After obtaining informed consent, the colonoscope                            was passed under direct vision. Throughout the                            procedure, the patient's blood pressure, pulse, and                            oxygen saturations were monitored continuously. The                            [id number] ([id number]) colonoscope was introduced                            through the anus and advanced to the the cecum,                            identified by appendiceal orifice and ileocecal                            valve. The colonoscopy was somewhat difficult due                            to restricted mobility of the colon and a tortuous                            colon. Successful completion of the procedure was                            aided by changing the patient's position, using                            manual pressure, straightening and shortening the  scope to obtain bowel loop reduction and using                            scope torsion. The patient tolerated the procedure. Scope In: 8:29:49 AM Scope Out: 8:52:54 AM Scope Withdrawal Time: 0 hours 14 minutes 50 seconds  Total Procedure Duration: 0 hours 23 minutes 5 seconds  Findings:      The digital rectal exam findings include hemorrhoids. Pertinent       negatives include no palpable rectal lesions.      The left colon was significantly tortuous.      Two sessile polyps were found in the descending colon and cecum. The       polyps were 3 to 4 mm in size.      A tattoo was seen in the sigmoid colon. A post-polypectomy scar was       found at the tattoo site.      Many medium-mouthed and small-mouthed diverticula were found in the       entire colon.      Normal mucosa was found in the entire colon.      Non-bleeding non-thrombosed external and internal hemorrhoids were found        during retroflexion, during perianal exam and during digital exam. The       hemorrhoids were Grade II (internal hemorrhoids that prolapse but reduce       spontaneously). Impression:               \                           - Hemorrhoids found on digital rectal exam.                           - Tortuous colon.                           - Two 3 to 4 mm polyps in the descending colon and                            in the cecum.                           - A tattoo was seen in the sigmoid colon. A                            post-polypectomy scar was found at the tattoo site.                           - Diverticulosis in the entire examined colon.                           - Normal mucosa in the entire examined colon.                           - Non-bleeding non-thrombosed external and internal                            hemorrhoids.                           -  No specim Moderate Sedation:      Not Applicable - Patient had care per Anesthesia. Recommendation:           - The patient will be observed post-procedure,                            until all discharge criteria are met.                           - Discharge patient to home.                           - Patient has a contact number available for                            emergencies. The signs and symptoms of potential                            delayed complications were discussed with the                            patient. Return to normal activities tomorrow.                            Written discharge instructions were provided to the                            patient.                           - High fiber diet.                           - Use FiberCon 1-2 tablets PO daily.                           - Continue present medications.                           - Await pathology results.                           - Repeat colonoscopy in 3 years for surveillance.                            This can be with patient's primary  GI.                           - The findings and recommendations were discussed                            with the patient.                           - The findings and recommendations were discussed  with the patient's family. Procedure Code(s):        --- Professional ---                           H9894, Colorectal cancer screening; colonoscopy on                            individual at high risk Diagnosis Code(s):        --- Professional ---                           D12.4, Benign neoplasm of descending colon                           D12.0, Benign neoplasm of cecum                           K64.1, Second degree hemorrhoids                           Z86.010, Personal history of colonic polyps                           K57.30, Diverticulosis of large intestine without                            perforation or abscess without bleeding                           Q43.8, Other specified congenital malformations of                            intestine CPT copyright 2022 American Medical Association. All rights reserved. The codes documented in this report are preliminary and upon coder review may  be revised to meet current compliance requirements. Aloha Finner, MD 05/01/2024 9:04:16 AM Number of Addenda: 1 Addendum Number: 1   Addendum Date: 05/15/2024 4:51:35 PM      The polyps that were noted were removed with cold snare polypectomy.      This acts as addendum to the notation in the chart. Aloha Finner, MD 05/15/2024 4:52:10 PM

## 2024-05-01 NOTE — Transfer of Care (Signed)
 Immediate Anesthesia Transfer of Care Note  Patient: Melanie Phelps  Procedure(s) Performed: COLONOSCOPY  Patient Location: Endoscopy Unit  Anesthesia Type:MAC  Level of Consciousness: awake and patient cooperative  Airway & Oxygen Therapy: Patient Spontanous Breathing and Patient connected to nasal cannula oxygen  Post-op Assessment: Report given to RN and Post -op Vital signs reviewed and stable  Post vital signs: Reviewed and stable  Last Vitals:  Vitals Value Taken Time  BP    Temp    Pulse    Resp    SpO2      Last Pain:  Vitals:   05/01/24 0715  TempSrc: Temporal  PainSc: 0-No pain         Complications: No notable events documented.

## 2024-05-01 NOTE — Anesthesia Procedure Notes (Signed)
 Procedure Name: MAC Date/Time: 05/01/2024 8:24 AM  Performed by: Judythe Tanda Aran, CRNAPre-anesthesia Checklist: Patient identified, Emergency Drugs available, Suction available and Patient being monitored Patient Re-evaluated:Patient Re-evaluated prior to induction Oxygen Delivery Method: Simple face mask

## 2024-05-02 ENCOUNTER — Encounter (HOSPITAL_COMMUNITY): Payer: Self-pay | Admitting: Gastroenterology

## 2024-05-03 ENCOUNTER — Ambulatory Visit: Admitting: Gastroenterology

## 2024-05-03 ENCOUNTER — Ambulatory Visit: Payer: Self-pay | Admitting: Gastroenterology

## 2024-05-03 LAB — SURGICAL PATHOLOGY

## 2024-05-09 DIAGNOSIS — R7303 Prediabetes: Secondary | ICD-10-CM | POA: Diagnosis not present

## 2024-08-11 ENCOUNTER — Ambulatory Visit

## 2024-08-11 ENCOUNTER — Encounter: Payer: Self-pay | Admitting: Podiatry

## 2024-08-11 ENCOUNTER — Ambulatory Visit: Payer: Self-pay | Admitting: Podiatry

## 2024-08-11 DIAGNOSIS — G5782 Other specified mononeuropathies of left lower limb: Secondary | ICD-10-CM

## 2024-08-11 NOTE — Patient Instructions (Signed)

## 2024-08-15 ENCOUNTER — Other Ambulatory Visit (HOSPITAL_BASED_OUTPATIENT_CLINIC_OR_DEPARTMENT_OTHER): Payer: Self-pay | Admitting: Family Medicine

## 2024-08-15 DIAGNOSIS — M8580 Other specified disorders of bone density and structure, unspecified site: Secondary | ICD-10-CM

## 2024-08-16 ENCOUNTER — Other Ambulatory Visit: Payer: Self-pay | Admitting: Family Medicine

## 2024-08-16 DIAGNOSIS — Z1231 Encounter for screening mammogram for malignant neoplasm of breast: Secondary | ICD-10-CM

## 2024-08-16 NOTE — Progress Notes (Signed)
 Subjective:   Patient ID: Melanie Phelps, female   DOB: 74 y.o.   MRN: [id number]   HPI Patient states she is having a lot of pain between the 3rd and 4th toes of her left foot with her third digit affected the most.  States that shooting burning reminds her of where the nerve affected her before but seems to be a different area.  Feels like she probably has the same condition that we did surgery on 5 years ago.  Patient does not smoke likes to be active   Review of Systems  All other systems reviewed and are negative.       Objective:  Physical Exam Vitals and nursing note reviewed.  Constitutional:      Appearance: She is well-developed.  Pulmonary:     Effort: Pulmonary effort is normal.  Musculoskeletal:        General: Normal range of motion.  Skin:    General: Skin is warm.  Neurological:     Mental Status: She is alert.     Neurovascular status intact muscle strength found to be adequate range of motion within normal limits with patient found to have a palpable mass third interspace left foot with radiating discomfort into the adjacent digit when I pressed it.  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  She has opted to have it fixed as she did so well with the second interspace removal and would rather do this then try injections which did not do anything for her.  She is very comfortable with this and I allowed her to read the consent form line by line going over all possible complications alternative treatments states she has tried wider shoes she has tried anti-inflammatories and padding and would prefer to have fixed.  Signed consent form understands no guarantee there is a neuroma there and understands recovery takes several months.  All questions answered and scheduled for outpatient procedure  X-rays indicate no  signs of calcification appears to be soft tissue

## 2024-08-24 ENCOUNTER — Ambulatory Visit: Admission: RE | Admit: 2024-08-24 | Discharge: 2024-08-24 | Disposition: A | Source: Ambulatory Visit

## 2024-08-24 DIAGNOSIS — Z1231 Encounter for screening mammogram for malignant neoplasm of breast: Secondary | ICD-10-CM
